# Patient Record
Sex: Female | Born: 1990 | Race: White | Hispanic: No | Marital: Single | Smoking: Former smoker
Health system: Southern US, Community
[De-identification: ages and names within clinical notes are randomized; demographics above are authoritative.]

## PROBLEM LIST (undated history)

## (undated) DIAGNOSIS — IMO0001 Reserved for inherently not codable concepts without codable children: Secondary | ICD-10-CM

## (undated) DIAGNOSIS — F329 Major depressive disorder, single episode, unspecified: Principal | ICD-10-CM

## (undated) DIAGNOSIS — F419 Anxiety disorder, unspecified: Principal | ICD-10-CM

## (undated) DIAGNOSIS — T7840XA Allergy, unspecified, initial encounter: Secondary | ICD-10-CM

## (undated) DIAGNOSIS — IMO0002 Reserved for concepts with insufficient information to code with codable children: Secondary | ICD-10-CM

## (undated) DIAGNOSIS — R569 Unspecified convulsions: Secondary | ICD-10-CM

## (undated) DIAGNOSIS — J45909 Unspecified asthma, uncomplicated: Secondary | ICD-10-CM

## (undated) HISTORY — DX: Reserved for inherently not codable concepts without codable children: IMO0001

## (undated) HISTORY — DX: Unspecified asthma, uncomplicated: J45.909

## (undated) HISTORY — DX: Anxiety disorder, unspecified: F41.9

## (undated) HISTORY — DX: Major depressive disorder, single episode, unspecified: F32.9

## (undated) HISTORY — DX: Allergy, unspecified, initial encounter: T78.40XA

## (undated) HISTORY — DX: Unspecified convulsions: R56.9

---
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
## 2008-03-07 ENCOUNTER — Emergency Department (HOSPITAL_COMMUNITY): Admission: EM | Admit: 2008-03-07 | Discharge: 2008-03-07 | Payer: Self-pay | Admitting: Emergency Medicine

## 2010-01-29 ENCOUNTER — Emergency Department (HOSPITAL_COMMUNITY): Admission: EM | Admit: 2010-01-29 | Discharge: 2010-01-29 | Payer: Self-pay | Admitting: Emergency Medicine

## 2011-02-11 LAB — POCT I-STAT, CHEM 8
Calcium, Ion: 1.25
Creatinine, Ser: 0.9
Glucose, Bld: 100 — ABNORMAL HIGH
HCT: 34 — ABNORMAL LOW
Hemoglobin: 11.6 — ABNORMAL LOW
TCO2: 23

## 2011-02-11 LAB — URINALYSIS, ROUTINE W REFLEX MICROSCOPIC
Bilirubin Urine: NEGATIVE
Glucose, UA: NEGATIVE
Hgb urine dipstick: NEGATIVE
Ketones, ur: NEGATIVE
Nitrite: NEGATIVE
Protein, ur: NEGATIVE
Specific Gravity, Urine: 1.014
Urobilinogen, UA: 0.2
pH: 5.5

## 2011-02-11 LAB — RAPID URINE DRUG SCREEN, HOSP PERFORMED
Amphetamines: NOT DETECTED
Barbiturates: NOT DETECTED
Benzodiazepines: POSITIVE — AB
Cocaine: NOT DETECTED
Opiates: NOT DETECTED
Tetrahydrocannabinol: NOT DETECTED

## 2011-02-11 LAB — POCT PREGNANCY, URINE: Preg Test, Ur: NEGATIVE

## 2011-02-11 LAB — GLUCOSE, CAPILLARY: Glucose-Capillary: 93

## 2011-02-11 LAB — ETHANOL: Alcohol, Ethyl (B): <5

## 2011-10-02 ENCOUNTER — Ambulatory Visit (INDEPENDENT_AMBULATORY_CARE_PROVIDER_SITE_OTHER): Payer: BC Managed Care – PPO | Admitting: Internal Medicine

## 2011-10-02 ENCOUNTER — Encounter: Payer: Self-pay | Admitting: Internal Medicine

## 2011-10-02 VITALS — BP 120/84 | HR 88 | Temp 98.3°F | Wt 169.0 lb

## 2011-10-02 DIAGNOSIS — M25569 Pain in unspecified knee: Secondary | ICD-10-CM

## 2011-10-02 NOTE — Progress Notes (Signed)
  Subjective:    Patient ID: Victoria Ross, female    DOB: [birth year], 21 y.o.   MRN: [id number]  HPI New patient, acute visit. 4 days ago, she jumped off a 3 feet tall wall, landed on her feet, no fall or apparent problems. The next day developed right knee pain which is getting gradually worse. The pain is located anteriorly around the kneecap and somehow in the popliteal area. Very mild swelling. She has taken ibuprofen 800 mg and Vicodin with very little help.  Past medical history H/o ADD  G0P0 Not on birth-control pills  Past surgical history no Social history Referred to this office by the [facility] (my patients). Single  Tobacco--  1/2 ppd  ETOH -- socially   Family history Adopted    Review of Systems Denies any pain or swelling in the calves. No other injuries such as neck or shoulder pain.     Objective:   Physical Exam General -- alert, well-developed, some distress due to pain, unable to put pressure in the knee.  Extremities--  Left knee normal to inspection, palpation and passive mobilization. Right knee, very little if any swelling, very tender to palpation anteriorly distal from the kneecap. I attempted passive mobilization but it caused severe pains so I stopped. Calves  are soft, nontender. Right calf is half centimeter larger than the left.  Psych-- Cognition and judgment appear intact. Alert and cooperative with normal attention span and concentration.  not anxious appearing and not depressed appearing.       Assessment & Plan:    Acute right knee pain. Patient is in severe pain, will refer to orthopedic surgery today. She reports similar pain when she was 21 years old, resolved after physical therapy The right calf is half centimeter larger, but soft and nontender, I doubt she has a DVT , asked her to call me if she notices any pain-swelling in that area. See instructions.

## 2011-10-02 NOTE — Patient Instructions (Signed)
[facility], Dr. Renae Fickle at 3:30 PM today. Arrive 15 minutes earlier  [address] [place], [place] [address] [phone]

## 2011-10-08 ENCOUNTER — Telehealth: Payer: Self-pay | Admitting: *Deleted

## 2011-10-08 NOTE — Telephone Encounter (Signed)
Called pt to check & see how her knee was. Pt did not answer & was unable to leave a msg.

## 2011-10-10 ENCOUNTER — Ambulatory Visit: Payer: Self-pay | Admitting: Internal Medicine

## 2011-12-09 ENCOUNTER — Ambulatory Visit (INDEPENDENT_AMBULATORY_CARE_PROVIDER_SITE_OTHER): Payer: BC Managed Care – PPO | Admitting: Internal Medicine

## 2011-12-09 ENCOUNTER — Encounter: Payer: Self-pay | Admitting: Internal Medicine

## 2011-12-09 VITALS — BP 120/82 | HR 64 | Temp 98.2°F | Wt 166.0 lb

## 2011-12-09 DIAGNOSIS — R1909 Other intra-abdominal and pelvic swelling, mass and lump: Secondary | ICD-10-CM | POA: Insufficient documentation

## 2011-12-09 DIAGNOSIS — IMO0002 Reserved for concepts with insufficient information to code with codable children: Secondary | ICD-10-CM | POA: Insufficient documentation

## 2011-12-09 DIAGNOSIS — S82899A Other fracture of unspecified lower leg, initial encounter for closed fracture: Secondary | ICD-10-CM

## 2011-12-09 DIAGNOSIS — R1900 Intra-abdominal and pelvic swelling, mass and lump, unspecified site: Secondary | ICD-10-CM

## 2011-12-09 NOTE — Assessment & Plan Note (Signed)
Tender inguinal mass, hernia versus lymphadenopathy. There is no signs of incarceration. Lower abdomen is a slightly tender without peritoneal signs Plan: Refer to surgery as an outpatient, I'll ask them to see her tomorrow. ER if symptoms severe, fever, chills, nausea, vomiting, abdominal pain. Check a UA

## 2011-12-09 NOTE — Progress Notes (Signed)
  Subjective:    Patient ID: Victoria Ross, female    DOB: [birth year], 21 y.o.   MRN: [id number]  HPI Acute visit 4 days ago, she found a knot on the right groin area, it hurts some when she pushes the area. In the last 4 days it has grown in size a little bit currently at the size of a quarter.  Past medical history H/o ADD   G0P0 Not on birth-control pills Tibial plateau fracture, right side, treated conservatively 09/2011  Past surgical history no Social history  Referred to this office by the [facility] (my patients). 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 No dysuria or gross hematuria. No vaginal discharge or rash Was seen at orthopedic surgery for knee pain, see assessment and plan    Objective:   Physical Exam  Abdominal:      alert oriented x3, no apparent distress, vital signs stable, afebrile. Abdomen, not distended, soft, slightly tender at the lower abdomen, R>L , no mass or rebound. Good bowel sounds.      Assessment & Plan:

## 2011-12-09 NOTE — Patient Instructions (Addendum)
Please see Dr. Daphine Deutscher, tomorrow at 3 PM at [facility]. [address], [place], [place] [address] [phone] ---- ER is fever, chills, increased pain, increased in the mass size, abdominal pain, fever, chills

## 2011-12-09 NOTE — Assessment & Plan Note (Signed)
09/2011 was seen with a knee injury, eventually saw orthopedic surgery, dx with a tibial plateau fracture, treated conservatively, currently doing better

## 2011-12-10 ENCOUNTER — Encounter (INDEPENDENT_AMBULATORY_CARE_PROVIDER_SITE_OTHER): Payer: Self-pay | Admitting: Surgery

## 2011-12-10 ENCOUNTER — Other Ambulatory Visit (INDEPENDENT_AMBULATORY_CARE_PROVIDER_SITE_OTHER): Payer: Self-pay | Admitting: General Surgery

## 2011-12-10 ENCOUNTER — Ambulatory Visit (INDEPENDENT_AMBULATORY_CARE_PROVIDER_SITE_OTHER): Payer: BC Managed Care – PPO | Admitting: Surgery

## 2011-12-10 VITALS — BP 112/62 | HR 68 | Temp 97.1°F | Resp 16 | Ht 64.0 in | Wt 165.4 lb

## 2011-12-10 DIAGNOSIS — K4030 Unilateral inguinal hernia, with obstruction, without gangrene, not specified as recurrent: Secondary | ICD-10-CM

## 2011-12-10 NOTE — Patient Instructions (Addendum)
Your surgeon is Dr. Almond Lint You should expect a call in the morning about an early afternoon surgery at [facility] Nothing to eat or drink after midnight tonight.    Inguinal Hernia, Adult Muscles help keep everything in the body in its proper place. But if a weak spot in the muscles develops, something can poke through. That is called a hernia. When this happens in the lower part of the belly (abdomen), it is called an inguinal hernia. (It takes its name from a part of the body in this region called the inguinal canal.) A weak spot in the wall of muscles lets some fat or part of the small intestine bulge through. An inguinal hernia can develop at any age. Men get them more often than women. CAUSES  In adults, an inguinal hernia develops over time.  It can be triggered by:   Suddenly straining the muscles of the lower abdomen.   Lifting heavy objects.   Straining to have a bowel movement. Difficult bowel movements (constipation) can lead to this.   Constant coughing. This may be caused by smoking or lung disease.   Being overweight.   Being pregnant.   Working at a job that requires long periods of standing or heavy lifting.   Having had an inguinal hernia before.  One type can be an emergency situation. It is called a strangulated inguinal hernia. It develops if part of the small intestine slips through the weak spot and cannot get back into the abdomen. The blood supply can be cut off. If that happens, part of the intestine may die. This situation requires emergency surgery. SYMPTOMS  Often, a small inguinal hernia has no symptoms. It is found when a healthcare provider does a physical exam. Larger hernias usually have symptoms.   In adults, symptoms may include:   A lump in the groin. This is easier to see when the person is standing. It might disappear when lying down.   In men, a lump in the scrotum.   Pain or burning in the groin. This occurs especially when lifting,  straining or coughing.   A dull ache or feeling of pressure in the groin.   Signs of a strangulated hernia can include:   A bulge in the groin that becomes very painful and tender to the touch.   A bulge that turns red or purple.   Fever, nausea and vomiting.   Inability to have a bowel movement or to pass gas.  DIAGNOSIS  To decide if you have an inguinal hernia, a healthcare provider will probably do a physical examination.  This will include asking questions about any symptoms you have noticed.   The healthcare provider might feel the groin area and ask you to cough. If an inguinal hernia is felt, the healthcare provider may try to slide it back into the abdomen.   Usually no other tests are needed.  TREATMENT  Treatments can vary. The size of the hernia makes a difference. Options include:  Watchful waiting. This is often suggested if the hernia is small and you have had no symptoms.   No medical procedure will be done unless symptoms develop.   You will need to watch closely for symptoms. If any occur, contact your healthcare provider right away.   Surgery. This is used if the hernia is larger or you have symptoms.   Open surgery. This is usually an outpatient procedure (you will not stay overnight in a hospital). An cut (incision) is  made through the skin in the groin. The hernia is put back inside the abdomen. The weak area in the muscles is then repaired by herniorrhaphy or hernioplasty. Herniorrhaphy: in this type of surgery, the weak muscles are sewn back together. Hernioplasty: a patch or mesh is used to close the weak area in the abdominal wall.   Laparoscopy. In this procedure, a surgeon makes small incisions. A thin tube with a tiny video camera (called a laparoscope) is put into the abdomen. The surgeon repairs the hernia with mesh by looking with the video camera and using two long instruments.  HOME CARE INSTRUCTIONS   After surgery to repair an inguinal hernia:     You will need to take pain medicine prescribed by your healthcare provider. Follow all directions carefully.   You will need to take care of the wound from the incision.   Your activity will be restricted for awhile. This will probably include no heavy lifting for several weeks. You also should not do anything too active for a few weeks. When you can return to work will depend on the type of job that you have.   During "watchful waiting" periods, you should:   Maintain a healthy weight.   Eat a diet high in fiber (fruits, vegetables and whole grains).   Drink plenty of fluids to avoid constipation. This means drinking enough water and other liquids to keep your urine clear or pale yellow.   Do not lift heavy objects.   Do not stand for long periods of time.   Quit smoking. This should keep you from developing a frequent cough.  SEEK MEDICAL CARE IF:   A bulge develops in your groin area.   You feel pain, a burning sensation or pressure in the groin. This might be worse if you are lifting or straining.   You develop a fever of more than 100.5 F (38.1 C).  SEEK IMMEDIATE MEDICAL CARE IF:   Pain in the groin increases suddenly.   A bulge in the groin gets bigger suddenly and does not go down.   For men, there is sudden pain in the scrotum. Or, the size of the scrotum increases.   A bulge in the groin area becomes red or purple and is painful to touch.   You have nausea or vomiting that does not go away.   You feel your heart beating much faster than normal.   You cannot have a bowel movement or pass gas.   You develop a fever of more than 102.0 F (38.9 C).  Document Released: 09/14/2008 Document Revised: 04/17/2011 Document Reviewed: 09/14/2008 [facility] Patient Information 2012 [place], [place].

## 2011-12-10 NOTE — Progress Notes (Signed)
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 Willow Ora with a mass in her right groin that he palpated and tried to reduce.  Subsequently, it was painful.  He made an appointment for her to see me in the office today but I am unavailable to do this in a timely fashion.  Dr. Donell Beers has agreed to see and perform as [facility] DOW.    She denies nausea or vomiting.  Tender since her exam yesterday.  I explained open inguinal exploration for inguinal and femoral herniae.    Past Medical History  Diagnosis Date  . Seizures     History reviewed. No pertinent past surgical history.  Current Outpatient Prescriptions  Medication Sig Dispense Refill  . ibuprofen (ADVIL,MOTRIN) 800 MG tablet Take 800 mg by mouth every 6 (six) hours as needed.       Tramadol Family History  Problem Relation Age of Onset  . Adopted: Yes  . Family history unknown: Yes   Social History:   reports that she has been smoking Cigarettes.  She has a 2 pack-year smoking history. She has never used smokeless tobacco. She reports that she drinks alcohol. She reports that she does not use illicit drugs.   REVIEW OF SYSTEMS - PERTINENT POSITIVES ONLY: Wearing a brace for knee fracture requiring crutches for 8 weeks.  Denies DVT.    Physical Exam:   Blood pressure 112/62, pulse 68, temperature 97.1 F (36.2 C), temperature source Temporal, resp. rate 16, height 5\' 4"  (1.626 m), weight 165 lb 6.4 oz (75.025 kg), last menstrual period 12/06/2011. Body mass index is 28.39 kg/(m^2).  Gen:  WDWN WF NAD  Neurological: Alert and oriented to person, place, and time. Motor and sensory function is grossly intact  Head: Normocephalic and atraumatic.  Eyes: Conjunctivae are normal. Pupils are equal, round, and reactive to light. No scleral icterus.  Neck: Normal range of motion. Neck supple. No tracheal deviation or thyromegaly present.    Cardiovascular:  SR without murmurs or gallops.  No carotid bruits Respiratory: Effort normal.  No respiratory distress. No chest wall tenderness. Breath sounds normal.  No wheezes, rales or rhonchi.  Abdomen:  Tender soft mass in the right inguinal canal.   GU: Musculoskeletal: Normal range of motion. Extremities are nontender. No cyanosis, edema or clubbing noted Lymphadenopathy: No cervical, preauricular, postauricular or axillary adenopathy is present Skin: Skin is warm and dry. No rash noted. No diaphoresis. No erythema. No pallor. Pscyh: Normal mood and affect. Behavior is normal. Judgment and thought content normal.   LABORATORY RESULTS: No results found for this or any previous visit (from the past 48 hour(s)).  RADIOLOGY RESULTS: No results found.  Problem List: Patient Active Problem List  Diagnosis  . Inguinal mass  . Knee fracture    Assessment & Plan: Symptomatic right inguinal hernia.  Repair Thursday at [facility] B. Daphine Deutscher, MD, [facility] [phone] beeper [phone]  12/10/2011 5:23 PM

## 2011-12-11 ENCOUNTER — Encounter (HOSPITAL_COMMUNITY): Payer: Self-pay | Admitting: *Deleted

## 2011-12-11 ENCOUNTER — Encounter (HOSPITAL_COMMUNITY)
Admission: RE | Disposition: A | Discharge status: Home / Self Care | Payer: Self-pay | Source: Ambulatory Visit | Attending: General Surgery

## 2011-12-11 ENCOUNTER — Encounter (HOSPITAL_COMMUNITY): Payer: Self-pay | Admitting: Certified Registered Nurse Anesthetist

## 2011-12-11 ENCOUNTER — Ambulatory Visit (HOSPITAL_COMMUNITY)
Admission: RE | Admit: 2011-12-11 | Discharge: 2011-12-11 | Disposition: A | Discharge status: Home / Self Care | Payer: BC Managed Care – PPO | Source: Ambulatory Visit | Attending: General Surgery | Admitting: General Surgery

## 2011-12-11 ENCOUNTER — Ambulatory Visit (HOSPITAL_COMMUNITY): Payer: BC Managed Care – PPO | Admitting: Certified Registered Nurse Anesthetist

## 2011-12-11 DIAGNOSIS — R599 Enlarged lymph nodes, unspecified: Secondary | ICD-10-CM | POA: Insufficient documentation

## 2011-12-11 DIAGNOSIS — K4090 Unilateral inguinal hernia, without obstruction or gangrene, not specified as recurrent: Secondary | ICD-10-CM | POA: Insufficient documentation

## 2011-12-11 DIAGNOSIS — K4190 Unilateral femoral hernia, without obstruction or gangrene, not specified as recurrent: Secondary | ICD-10-CM | POA: Insufficient documentation

## 2011-12-11 HISTORY — PX: INGUINAL HERNIA REPAIR: SHX194

## 2011-12-11 HISTORY — DX: Reserved for concepts with insufficient information to code with codable children: IMO0002

## 2011-12-11 LAB — CBC
MCH: 29.4 pg (ref 26.0–34.0)
MCHC: 34 g/dL (ref 30.0–36.0)
MCV: 86.5 fL (ref 78.0–100.0)
Platelets: 204 10*3/uL (ref 150–400)
RBC: 4.15 MIL/uL (ref 3.87–5.11)
RDW: 12.2 % (ref 11.5–15.5)

## 2011-12-11 LAB — GRAM STAIN

## 2011-12-11 SURGERY — REPAIR, HERNIA, INGUINAL, ADULT
Anesthesia: General | Site: Groin | Laterality: Right | Wound class: Clean

## 2011-12-11 MED ORDER — PROPOFOL 10 MG/ML IV EMUL
INTRAVENOUS | Status: DC | PRN
  Administered 2011-12-11: 150 mg via INTRAVENOUS

## 2011-12-11 MED ORDER — ACETAMINOPHEN 10 MG/ML IV SOLN
INTRAVENOUS | Status: AC
  Filled 2011-12-11: qty 100

## 2011-12-11 MED ORDER — FENTANYL CITRATE 0.05 MG/ML IJ SOLN: 25.0000 ug | INTRAMUSCULAR | Status: DC | PRN

## 2011-12-11 MED ORDER — ACETAMINOPHEN 650 MG RE SUPP
650.0000 mg | RECTAL | Status: DC | PRN
  Filled 2011-12-11: qty 1

## 2011-12-11 MED ORDER — BUPIVACAINE LIPOSOME 1.3 % IJ SUSP
20.0000 mL | Freq: Once | INTRAMUSCULAR | Status: AC
  Administered 2011-12-11: 20 mL
  Filled 2011-12-11: qty 20

## 2011-12-11 MED ORDER — ACETAMINOPHEN 10 MG/ML IV SOLN
INTRAVENOUS | Status: DC | PRN
  Administered 2011-12-11: 1000 mg via INTRAVENOUS

## 2011-12-11 MED ORDER — MUPIROCIN CALCIUM 2 % EX CREA
TOPICAL_CREAM | Freq: Two times a day (BID) | CUTANEOUS | Status: DC
  Filled 2011-12-11: qty 15

## 2011-12-11 MED ORDER — ONDANSETRON HCL 4 MG/2ML IJ SOLN: 4.0000 mg | Freq: Four times a day (QID) | INTRAMUSCULAR | Status: DC | PRN

## 2011-12-11 MED ORDER — PROMETHAZINE HCL 25 MG/ML IJ SOLN: 6.2500 mg | INTRAMUSCULAR | Status: DC | PRN

## 2011-12-11 MED ORDER — SCOPOLAMINE 1 MG/3DAYS TD PT72
MEDICATED_PATCH | TRANSDERMAL | Status: DC | PRN
  Administered 2011-12-11: 1 via TRANSDERMAL

## 2011-12-11 MED ORDER — LIDOCAINE HCL (CARDIAC) 20 MG/ML IV SOLN
INTRAVENOUS | Status: DC | PRN
  Administered 2011-12-11: 80 mg via INTRAVENOUS

## 2011-12-11 MED ORDER — ONDANSETRON HCL 4 MG/2ML IJ SOLN
INTRAMUSCULAR | Status: DC | PRN
  Administered 2011-12-11: 4 mg via INTRAVENOUS

## 2011-12-11 MED ORDER — CEFAZOLIN SODIUM-DEXTROSE 2-3 GM-% IV SOLR
2.0000 g | INTRAVENOUS | Status: AC
  Administered 2011-12-11: 2 g via INTRAVENOUS

## 2011-12-11 MED ORDER — SODIUM CHLORIDE 0.9 % IV SOLN: 250.0000 mL | INTRAVENOUS | Status: DC | PRN

## 2011-12-11 MED ORDER — SODIUM CHLORIDE 0.9 % IJ SOLN: 3.0000 mL | INTRAMUSCULAR | Status: DC | PRN

## 2011-12-11 MED ORDER — OXYCODONE HCL 5 MG PO TABS
5.0000 mg | ORAL_TABLET | ORAL | Status: DC | PRN
  Administered 2011-12-11: 5 mg via ORAL
  Filled 2011-12-11: qty 1

## 2011-12-11 MED ORDER — FENTANYL CITRATE 0.05 MG/ML IJ SOLN
INTRAMUSCULAR | Status: DC | PRN
  Administered 2011-12-11: 50 ug via INTRAVENOUS
  Administered 2011-12-11: 100 ug via INTRAVENOUS
  Administered 2011-12-11: 50 ug via INTRAVENOUS

## 2011-12-11 MED ORDER — MIDAZOLAM HCL 5 MG/5ML IJ SOLN
INTRAMUSCULAR | Status: DC | PRN
  Administered 2011-12-11: 2 mg via INTRAVENOUS

## 2011-12-11 MED ORDER — LACTATED RINGERS IV SOLN
INTRAVENOUS | Status: DC
  Administered 2011-12-11: 1000 mL via INTRAVENOUS

## 2011-12-11 MED ORDER — HYDROCODONE-ACETAMINOPHEN 5-325 MG PO TABS: 1.0000 | ORAL_TABLET | ORAL | Status: DC | PRN

## 2011-12-11 MED ORDER — SCOPOLAMINE 1 MG/3DAYS TD PT72
MEDICATED_PATCH | TRANSDERMAL | Status: AC
  Filled 2011-12-11: qty 1

## 2011-12-11 MED ORDER — ACETAMINOPHEN 325 MG PO TABS: 650.0000 mg | ORAL_TABLET | ORAL | Status: DC | PRN

## 2011-12-11 MED ORDER — DEXAMETHASONE SODIUM PHOSPHATE 10 MG/ML IJ SOLN
INTRAMUSCULAR | Status: DC | PRN
  Administered 2011-12-11: 10 mg via INTRAVENOUS

## 2011-12-11 MED ORDER — CEFAZOLIN SODIUM-DEXTROSE 2-3 GM-% IV SOLR
INTRAVENOUS | Status: AC
  Filled 2011-12-11: qty 50

## 2011-12-11 MED ORDER — SUCCINYLCHOLINE CHLORIDE 20 MG/ML IJ SOLN
INTRAMUSCULAR | Status: DC | PRN
  Administered 2011-12-11: 100 mg via INTRAVENOUS

## 2011-12-11 MED ORDER — SODIUM CHLORIDE 0.9 % IJ SOLN: 3.0000 mL | Freq: Two times a day (BID) | INTRAMUSCULAR | Status: DC

## 2011-12-11 SURGICAL SUPPLY — 46 items
ADH SKN CLS APL DERMABOND .7 (GAUZE/BANDAGES/DRESSINGS) ×1
APL SKNCLS STERI-STRIP NONHPOA (GAUZE/BANDAGES/DRESSINGS)
BENZOIN TINCTURE PRP APPL 2/3 (GAUZE/BANDAGES/DRESSINGS) IMPLANT
BLADE HEX COATED 2.75 (ELECTRODE) ×2 IMPLANT
BLADE SURG 15 STRL LF DISP TIS (BLADE) ×1 IMPLANT
BLADE SURG 15 STRL SS (BLADE) ×2
CANISTER SUCTION 2500CC (MISCELLANEOUS) ×2 IMPLANT
CLOTH BEACON ORANGE TIMEOUT ST (SAFETY) ×2 IMPLANT
DECANTER SPIKE VIAL GLASS SM (MISCELLANEOUS) ×2 IMPLANT
DERMABOND ADVANCED (GAUZE/BANDAGES/DRESSINGS) ×1
DERMABOND ADVANCED .7 DNX12 (GAUZE/BANDAGES/DRESSINGS) IMPLANT
DISSECTOR ROUND CHERRY 3/8 STR (MISCELLANEOUS) ×2 IMPLANT
DRAIN PENROSE 18X1/2 LTX STRL (DRAIN) IMPLANT
DRAPE LAPAROSCOPIC ABDOMINAL (DRAPES) ×2 IMPLANT
ELECT REM PT RETURN 9FT ADLT (ELECTROSURGICAL) ×2
ELECTRODE REM PT RTRN 9FT ADLT (ELECTROSURGICAL) ×1 IMPLANT
GLOVE BIO SURGEON STRL SZ 6 (GLOVE) ×4 IMPLANT
GLOVE BIOGEL PI IND STRL 6.5 (GLOVE) ×1 IMPLANT
GLOVE BIOGEL PI IND STRL 7.0 (GLOVE) ×1 IMPLANT
GLOVE BIOGEL PI INDICATOR 6.5 (GLOVE) ×1
GLOVE BIOGEL PI INDICATOR 7.0 (GLOVE) ×2
GLOVE INDICATOR 6.5 STRL GRN (GLOVE) ×4 IMPLANT
GOWN PREVENTION PLUS XXLARGE (GOWN DISPOSABLE) ×2 IMPLANT
KIT BASIN OR (CUSTOM PROCEDURE TRAY) ×2 IMPLANT
MESH PARIETEX PROGRIP RIGHT (Mesh General) ×1 IMPLANT
NDL HYPO 25X1 1.5 SAFETY (NEEDLE) ×1 IMPLANT
NEEDLE HYPO 25X1 1.5 SAFETY (NEEDLE) ×2 IMPLANT
NS IRRIG 1000ML POUR BTL (IV SOLUTION) ×3 IMPLANT
PACK BASIC VI WITH GOWN DISP (CUSTOM PROCEDURE TRAY) ×2 IMPLANT
PENCIL BUTTON HOLSTER BLD 10FT (ELECTRODE) ×2 IMPLANT
SPONGE GAUZE 4X4 12PLY (GAUZE/BANDAGES/DRESSINGS) ×2 IMPLANT
SPONGE LAP 18X18 X RAY DECT (DISPOSABLE) ×2 IMPLANT
STRIP CLOSURE SKIN 1/2X4 (GAUZE/BANDAGES/DRESSINGS) IMPLANT
SUT MNCRL AB 4-0 PS2 18 (SUTURE) ×2 IMPLANT
SUT PROLENE 2 0 SH DA (SUTURE) ×4 IMPLANT
SUT SILK 2 0 SH (SUTURE) IMPLANT
SUT SILK 3 0 (SUTURE)
SUT SILK 3-0 18XBRD TIE 12 (SUTURE) IMPLANT
SUT VIC AB 2-0 SH 27 (SUTURE) ×2
SUT VIC AB 2-0 SH 27X BRD (SUTURE) ×1 IMPLANT
SUT VIC AB 3-0 SH 27 (SUTURE) ×4
SUT VIC AB 3-0 SH 27XBRD (SUTURE) ×2 IMPLANT
SYR BULB IRRIGATION 50ML (SYRINGE) ×2 IMPLANT
SYR CONTROL 10ML LL (SYRINGE) ×2 IMPLANT
TOWEL OR 17X26 10 PK STRL BLUE (TOWEL DISPOSABLE) ×2 IMPLANT
YANKAUER SUCT BULB TIP 10FT TU (MISCELLANEOUS) ×2 IMPLANT

## 2011-12-11 NOTE — Interval H&P Note (Signed)
History and Physical Interval Note:  12/11/2011 12:55 PM  Victoria Ross  has presented today for surgery, with the diagnosis of right inguinal hernia  The various methods of treatment have been discussed with the patient and family. After consideration of risks, benefits and other options for treatment, the patient has consented to  Procedure(s) (LRB): HERNIA REPAIR INGUINAL ADULT (Right) INSERTION OF MESH (Right) as a surgical intervention .  The patient's history has been reviewed, patient examined, no change in status, stable for surgery.  I have reviewed the patient's chart and labs.  Questions were answered to the patient's satisfaction.   I discussed that I may find an incarcerated loop of bowel which may require more extensive surgery than previously thought.    Teegan Guinther

## 2011-12-11 NOTE — Anesthesia Postprocedure Evaluation (Signed)
  Anesthesia Post-op Note  Patient: Victoria Ross  Procedure(s) Performed: Procedure(s) (LRB): HERNIA REPAIR INGUINAL ADULT (Right) INSERTION OF MESH (Right)  Patient Location: PACU  Anesthesia Type: General  Level of Consciousness: awake and alert   Airway and Oxygen Therapy: Patient Spontanous Breathing  Post-op Pain: mild  Post-op Assessment: Post-op Vital signs reviewed, Patient's Cardiovascular Status Stable, Respiratory Function Stable, Patent Airway and No signs of Nausea or vomiting  Post-op Vital Signs: stable  Complications: No apparent anesthesia complications

## 2011-12-11 NOTE — Transfer of Care (Signed)
Immediate Anesthesia Transfer of Care Note  Patient: Victoria Ross  Procedure(s) Performed: Procedure(s) (LRB): HERNIA REPAIR INGUINAL ADULT (Right) INSERTION OF MESH (Right)  Patient Location: PACU  Anesthesia Type: General  Level of Consciousness: sedated, patient cooperative and responds to stimulaton  Airway & Oxygen Therapy: Patient Spontanous Breathing and Patient connected to face mask oxgen  Post-op Assessment: Report given to PACU RN and Post -op Vital signs reviewed and stable  Post vital signs: Reviewed and stable  Complications: No apparent anesthesia complications

## 2011-12-11 NOTE — Op Note (Signed)
Right inguinal lymph node biopsy, right inguinal and femoral hernia repair  Indications: The patient presented with a history of irreducible right groin bulge and tenderness.    Pre-operative Diagnosis: right incarcerated inguinal hernia  Post-operative Diagnosis: Necrotic right groin node, femoral hernia, direct inguinal hernia  Surgeon: Almond Lint   Anesthesia: General endotracheal anesthesia  ASA Class: 2  Procedure Details  The patient was seen again in the Holding Room. The risks, benefits, complications, treatment options, and expected outcomes were discussed with the patient. The possibilities of reaction to medication, pulmonary aspiration, perforation of viscus, bleeding, recurrent infection, the need for additional procedures, and development of a complication requiring transfusion or further operation were discussed with the patient and/or family. The likelihood of success in repairing the hernia and returning the patient to their previous functional status is good.  There was concurrence with the proposed plan, and informed consent was obtained. The site of surgery was properly noted/marked. The patient was taken to the Operating Room, identified as Victoria Ross, and the procedure verified as right inguinal hernia repair. A Time Out was held and the above information confirmed.  The patient was placed in the supine position and underwent induction of anesthesia. The lower abdomen and groin was prepped with Chloraprep and draped in the standard fashion, and Exparel was used to anesthetize the skin over the mid-portion of the inguinal canal. An oblique incision was made. Dissection was carried down through the subcutaneous tissue with cautery to the external oblique fascia.  I opened the external oblique fascia along the direction of its fibers to the external ring. The iliohypogastric and ilioinguinal nerves were tucked behind the external oblique.  The floor of the inguinal canal was  inspected and there was laxity of the inguinal floor.  The round ligament was skeletonized. There was no evidence of indirect inguinal hernia.  There was a large necrotic lymph node associated with fatty tissue that appeared to be the omentum.  This was resected.  There was enlargement of the femoral canal.  This defect was closed with the 2-0 prolene in mattress fashion.  The inguinal ligament was attached down to the adductor magnus.  The progrip mesh was selected and trimmed to the appropriate size.  This was secured with 2-0 Prolene to the pubic tubercle.  The mesh was tucked underneath the external oblique fascia laterally.  The iliohypogastric nerve could not be mobilized enough to lie on top of the mesh, so this was cut with the Metzenbaum scissors.  The ilioinguinal nerve was laid on top of the mesh.  Additional exparel was injected.    The external oblique fascia was reapproximated with 2-0 Vicryl.  3-0 Vicryl was used to close the subcutaneous tissues and 4-0 Monocryl was used to close the skin in subcuticular fashion.  Dermabond was used for dressing.  The patient was then extubated and brought to the recovery room in stable condition.  All sponge, instrument, and needle counts were correct prior to closure and at the conclusion of the case.   Estimated Blood Loss: Minimal           Complications: None; patient tolerated the procedure well.         Disposition: PACU - hemodynamically stable.         Condition: stable  Specimens:  Lymph node for lymphoma workup, and for gram stain, culture, afb and fungal stain.

## 2011-12-11 NOTE — H&P (View-Only) (Signed)
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 Jose Paz with a mass in her right groin that he palpated and tried to reduce.  Subsequently, it was painful.  He made an appointment for her to see me in the office today but I am unavailable to do this in a timely fashion.  Dr. Byerly has agreed to see and perform as [facility] DOW.    She denies nausea or vomiting.  Tender since her exam yesterday.  I explained open inguinal exploration for inguinal and femoral herniae.    Past Medical History  Diagnosis Date  . Seizures     History reviewed. No pertinent past surgical history.  Current Outpatient Prescriptions  Medication Sig Dispense Refill  . ibuprofen (ADVIL,MOTRIN) 800 MG tablet Take 800 mg by mouth every 6 (six) hours as needed.       Tramadol Family History  Problem Relation Age of Onset  . Adopted: Yes  . Family history unknown: Yes   Social History:   reports that she has been smoking Cigarettes.  She has a 2 pack-year smoking history. She has never used smokeless tobacco. She reports that she drinks alcohol. She reports that she does not use illicit drugs.   REVIEW OF SYSTEMS - PERTINENT POSITIVES ONLY: Wearing a brace for knee fracture requiring crutches for 8 weeks.  Denies DVT.    Physical Exam:   Blood pressure 112/62, pulse 68, temperature 97.1 F (36.2 C), temperature source Temporal, resp. rate 16, height 5' 4" (1.626 m), weight 165 lb 6.4 oz (75.025 kg), last menstrual period 12/06/2011. Body mass index is 28.39 kg/(m^2).  Gen:  WDWN WF NAD  Neurological: Alert and oriented to person, place, and time. Motor and sensory function is grossly intact  Head: Normocephalic and atraumatic.  Eyes: Conjunctivae are normal. Pupils are equal, round, and reactive to light. No scleral icterus.  Neck: Normal range of motion. Neck supple. No tracheal deviation or thyromegaly present.    Cardiovascular:  SR without murmurs or gallops.  No carotid bruits Respiratory: Effort normal.  No respiratory distress. No chest wall tenderness. Breath sounds normal.  No wheezes, rales or rhonchi.  Abdomen:  Tender soft mass in the right inguinal canal.   GU: Musculoskeletal: Normal range of motion. Extremities are nontender. No cyanosis, edema or clubbing noted Lymphadenopathy: No cervical, preauricular, postauricular or axillary adenopathy is present Skin: Skin is warm and dry. No rash noted. No diaphoresis. No erythema. No pallor. Pscyh: Normal mood and affect. Behavior is normal. Judgment and thought content normal.   LABORATORY RESULTS: No results found for this or any previous visit (from the past 48 hour(s)).  RADIOLOGY RESULTS: No results found.  Problem List: Patient Active Problem List  Diagnosis  . Inguinal mass  . Knee fracture    Assessment & Plan: Symptomatic right inguinal hernia.  Repair Thursday at [facility]    Matt B. Shirrell Solinger, MD, FACS  [facility] [phone] beeper [phone]  12/10/2011 5:23 PM     

## 2011-12-11 NOTE — Anesthesia Preprocedure Evaluation (Addendum)
Anesthesia Evaluation  Patient identified by MRN, date of birth, ID band Patient awake    Reviewed: Allergy & Precautions, H&P , NPO status , Patient's Chart, lab work & pertinent test results  Airway Mallampati: II TM Distance: >3 FB Neck ROM: Full    Dental No notable dental hx.    Pulmonary neg pulmonary ROS, Current Smoker,  breath sounds clear to auscultation  Pulmonary exam normal       Cardiovascular negative cardio ROS  Rhythm:Regular Rate:Normal     Neuro/Psych Seizures -,  negative psych ROS   GI/Hepatic negative GI ROS, Neg liver ROS,   Endo/Other  negative endocrine ROS  Renal/GU negative Renal ROS  negative genitourinary   Musculoskeletal negative musculoskeletal ROS (+)   Abdominal   Peds negative pediatric ROS (+)  Hematology negative hematology ROS (+)   Anesthesia Other Findings   Reproductive/Obstetrics negative OB ROS                           Anesthesia Physical Anesthesia Plan  ASA: II  Anesthesia Plan: General   Post-op Pain Management:    Induction: Intravenous  Airway Management Planned: Oral ETT  Additional Equipment:   Intra-op Plan:   Post-operative Plan: Extubation in OR  Informed Consent: I have reviewed the patients History and Physical, chart, labs and discussed the procedure including the risks, benefits and alternatives for the proposed anesthesia with the patient or authorized representative who has indicated his/her understanding and acceptance.   Dental advisory given  Plan Discussed with: CRNA  Anesthesia Plan Comments:         Anesthesia Quick Evaluation

## 2011-12-12 ENCOUNTER — Encounter (HOSPITAL_COMMUNITY): Payer: Self-pay | Admitting: General Surgery

## 2011-12-14 LAB — TISSUE CULTURE

## 2011-12-15 ENCOUNTER — Telehealth (INDEPENDENT_AMBULATORY_CARE_PROVIDER_SITE_OTHER): Payer: Self-pay

## 2011-12-15 NOTE — Telephone Encounter (Signed)
Patient called in for pain medicine refill. I called  [facility] on [address]  And she had a refill on file so I had it refilled. She asked if it was normal to have bloating and abdominal pain. She is passing gas but no bowl movement since surgery. I suggested her take gas-x and MOM or Mirlax to get bowels moving. She will call us back if she can't get them moving. As for abdominal pain I told her she can expect it to last 4-6 weeks but once she starts using the bathroom she may have some relief.

## 2011-12-16 LAB — ANAEROBIC CULTURE

## 2011-12-17 ENCOUNTER — Telehealth (INDEPENDENT_AMBULATORY_CARE_PROVIDER_SITE_OTHER): Payer: Self-pay | Admitting: Surgery

## 2011-12-17 NOTE — Telephone Encounter (Signed)
The patient called concerned about not having a bowel movement since surgery.  She tried oral laxatives.  She ate solid food and throughout.  She was concerned.  She is not toxic.  Known parable pain.  I recommend she try an enema.  She did not want to do this in the middle of the night.  She was hoping to wait until morning.  I recommend that she drink liquids only until she had a bowel movement.  I recommended that if she cannot keep anything down that she go to the emergency room.  She expressed understanding and appreciation

## 2011-12-19 ENCOUNTER — Encounter (INDEPENDENT_AMBULATORY_CARE_PROVIDER_SITE_OTHER): Payer: Self-pay | Admitting: General Surgery

## 2011-12-19 ENCOUNTER — Telehealth (INDEPENDENT_AMBULATORY_CARE_PROVIDER_SITE_OTHER): Payer: Self-pay | Admitting: General Surgery

## 2011-12-19 NOTE — Telephone Encounter (Signed)
Pt calling to verify her post op restrictions and get letter for work.  Discussed not driving until (1) not taking narcotics for pain control and (2) must wear seat belt.  Also no lifting, carrying, pushing or pulling anything > 20 lbs.  She will be evaluated for RTW at her appt on 01/02/12.  Letter generated and FAX to attn:  Debbie at [phone], to that effect.

## 2011-12-22 ENCOUNTER — Telehealth (INDEPENDENT_AMBULATORY_CARE_PROVIDER_SITE_OTHER): Payer: Self-pay

## 2011-12-22 MED ORDER — HYDROCODONE-ACETAMINOPHEN 5-325 MG PO TABS: 1.0000 | ORAL_TABLET | ORAL | Status: AC | PRN

## 2011-12-22 NOTE — Addendum Note (Signed)
Addended byLiliana Cline on: 12/22/2011 05:58 PM   Modules accepted: Orders

## 2011-12-22 NOTE — Telephone Encounter (Signed)
Pt calling for refill of Vicodin.  She is still having a lot of pain, and the Ibuprofen helps some, but she cannot sleep at night.  Call to [facility] [phone].

## 2011-12-22 NOTE — Telephone Encounter (Signed)
Ok per Dr Donell Beers to call in #60 of Norco 5/325. Called to [employer]. Patient made aware.

## 2011-12-25 ENCOUNTER — Encounter (INDEPENDENT_AMBULATORY_CARE_PROVIDER_SITE_OTHER): Payer: Self-pay

## 2012-01-05 ENCOUNTER — Encounter (INDEPENDENT_AMBULATORY_CARE_PROVIDER_SITE_OTHER): Payer: Self-pay

## 2012-01-05 ENCOUNTER — Ambulatory Visit (INDEPENDENT_AMBULATORY_CARE_PROVIDER_SITE_OTHER): Payer: BC Managed Care – PPO | Admitting: General Surgery

## 2012-01-05 ENCOUNTER — Encounter (INDEPENDENT_AMBULATORY_CARE_PROVIDER_SITE_OTHER): Payer: Self-pay | Admitting: General Surgery

## 2012-01-05 VITALS — BP 122/66 | HR 76 | Temp 97.9°F | Resp 16 | Ht 64.5 in | Wt 160.4 lb

## 2012-01-05 DIAGNOSIS — R1909 Other intra-abdominal and pelvic swelling, mass and lump: Secondary | ICD-10-CM

## 2012-01-05 DIAGNOSIS — R1900 Intra-abdominal and pelvic swelling, mass and lump, unspecified site: Secondary | ICD-10-CM

## 2012-01-05 NOTE — Progress Notes (Signed)
HISTORY: Patient is a 21 year old female who is now 3-1/2 weeks status post inguinal lymph node biopsy and inguinal/femoral hernia repair.  This was complicated by the fact that she had been in a knee immobilizer for a month prior to this. She was found to have a right inguinal bulge and taken to the operating room where the lymph node and hernia were found.  The hernia was very small.  She had significant pain for several weeks but now is feeling much better.  She is no longer taking pain medication.   EXAM: General:  Alert and oriented times 3 Incision:  Well healed.  Mild swelling.     PATHOLOGY: Lymph node, biopsy, right inguinal - NECROTIZING GRANULOMATOUS LYMPHADENITIS.   ASSESSMENT AND PLAN:   Right inguinal mass consistent with RIH Pt was found to have inguinal lymph node and hernia.  Doing better now.    Follow up as needed.  OK to go back to playing pool as tolerated.        Maudry Diego, MD Surgical Oncology, General & Endocrine Surgery [facility]  Willow Ora, MD Wanda Plump, MD

## 2012-01-05 NOTE — Patient Instructions (Signed)
OK to take baths/ swim.  No lifting over 30 pounds until 6 weeks post op.  (9/12)  OK to go back to playing pool when tolerated.  OK to get massage/chiropractic as desired.

## 2012-01-05 NOTE — Assessment & Plan Note (Signed)
Pt was found to have inguinal lymph node and hernia.  Doing better now.    Follow up as needed.  OK to go back to playing pool as tolerated.

## 2012-01-09 ENCOUNTER — Telehealth (INDEPENDENT_AMBULATORY_CARE_PROVIDER_SITE_OTHER): Payer: Self-pay | Admitting: General Surgery

## 2012-01-09 NOTE — Telephone Encounter (Signed)
Pt reports a sharp pain when she bends or twists slightly.  The pain resolves with repositioning.  Pt reassured this a common complaint at this point in her recovery from surgery.  She will call back as needed.

## 2012-01-19 ENCOUNTER — Ambulatory Visit (INDEPENDENT_AMBULATORY_CARE_PROVIDER_SITE_OTHER): Payer: BC Managed Care – PPO | Admitting: Internal Medicine

## 2012-01-19 ENCOUNTER — Encounter: Payer: Self-pay | Admitting: Internal Medicine

## 2012-01-19 VITALS — BP 110/78 | HR 76 | Temp 98.1°F | Ht 64.5 in | Wt 160.0 lb

## 2012-01-19 DIAGNOSIS — F419 Anxiety disorder, unspecified: Secondary | ICD-10-CM

## 2012-01-19 DIAGNOSIS — Z0000 Encounter for general adult medical examination without abnormal findings: Secondary | ICD-10-CM

## 2012-01-19 DIAGNOSIS — F32A Depression, unspecified: Secondary | ICD-10-CM

## 2012-01-19 DIAGNOSIS — F329 Major depressive disorder, single episode, unspecified: Secondary | ICD-10-CM

## 2012-01-19 HISTORY — DX: Depression, unspecified: F32.A

## 2012-01-19 HISTORY — DX: Anxiety disorder, unspecified: F41.9

## 2012-01-19 MED ORDER — ESCITALOPRAM OXALATE 10 MG PO TABS: 10.0000 mg | ORAL_TABLET | Freq: Every day | ORAL | Status: DC

## 2012-01-19 NOTE — Assessment & Plan Note (Signed)
Tdap today We'll get immunization records from previous PCP, a pediatrician. Does not recall having HPV or meningitis shot. Diet and exercise discussed Refer to gynecology

## 2012-01-19 NOTE — Assessment & Plan Note (Addendum)
See review of systems, she has issues with anxiety and depression. She is status post counseling and symptoms are slowly  increasing in the last few years. Plan:  Start Lexapro, she's not on birth control pills so will call if she ever suspect she is pregnant. Reassess in 6 weeks. Side effects discussed

## 2012-01-19 NOTE — Patient Instructions (Addendum)
Please sign a release of information and get the records from her previous doctor, specifically I'm interested in the immunizations Start Lexapro, come back in 6 weeks, call if side effects.

## 2012-01-19 NOTE — Progress Notes (Signed)
  Subjective:    Patient ID: Victoria Ross, female    DOB: Jan 22, 1991, 21 y.o.   MRN: [id number]  HPI CPX  Past medical history H/o ADD   G0P0 Not on birth-control pills Tibial plateau fracture, right side, treated conservatively 09/2011  Past surgical history 12-2011 Necrotic right groin node, femoral hernia, direct inguinal hernia (repaired w/ mesh)  Social history G0P0, lives w/ boyfriend Referred to this office by the [facility] (my patients). 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 No chest pain or shortness of breath No  nausea, vomiting, diarrhea For years, she had a "fear of disappointing people" which make her very nervous, anxious, and even depressed. Denies OCD type of symptoms At some point she was diagnosed with ADD but she thinks he was unable to focus due to feeling of anxiety. Currently denies any suicidal ideas but at some point in the past she did have some thoughts about it. She has known counseling but symptoms are increasing over the years gradually.  Current Outpatient Rx  Name Route Sig Dispense Refill  . ESCITALOPRAM OXALATE 10 MG PO TABS Oral Take 1 tablet (10 mg total) by mouth daily. 30 tablet 1       Objective:   Physical Exam  General -- alert, well-developed, and well-nourished.   Neck --no thyromegaly Lungs -- normal respiratory effort, no intercostal retractions, no accessory muscle use, and normal breath sounds.   Heart-- normal rate, regular rhythm, no murmur, and no gallop.   Abdomen--soft, non-tender, no distention, no masses, no HSM, no guarding, and no rigidity.   Extremities-- no pretibial edema bilaterally Neurologic-- alert & oriented X3 and strength normal in all extremities. Psych-- Cognition and  judgment appear intact. Alert and cooperative with normal attention span and concentration.  not anxious appearing and not depressed appearing.      Assessment & Plan:

## 2012-01-20 LAB — COMPREHENSIVE METABOLIC PANEL
CO2: 26 mEq/L (ref 19–32)
Creatinine, Ser: 0.6 mg/dL (ref 0.4–1.2)
GFR: 124.73 mL/min (ref >60.00)
Glucose, Bld: 74 mg/dL (ref 70–99)
Total Bilirubin: 0.6 mg/dL (ref 0.3–1.2)

## 2012-01-20 LAB — CBC WITH DIFFERENTIAL/PLATELET
Eosinophils Relative: 2.6 % (ref 0.0–5.0)
HCT: 37 % (ref 36.0–46.0)
Hemoglobin: 12.3 g/dL (ref 12.0–15.0)
Lymphs Abs: 3 10*3/uL (ref 0.7–4.0)
MCV: 88.4 fl (ref 78.0–100.0)
Monocytes Absolute: 0.4 10*3/uL (ref 0.1–1.0)
Monocytes Relative: 6 % (ref 3.0–12.0)
Neutro Abs: 2.7 10*3/uL (ref 1.4–7.7)
RDW: 12.8 % (ref 11.5–14.6)

## 2012-01-20 LAB — TSH: TSH: 1.61 u[IU]/mL (ref 0.35–5.50)

## 2012-01-20 LAB — CHOLESTEROL, TOTAL: Cholesterol: 128 mg/dL (ref 0–200)

## 2012-01-21 ENCOUNTER — Encounter: Payer: Self-pay | Admitting: *Deleted

## 2012-01-23 LAB — AFB CULTURE WITH SMEAR (NOT AT ARMC): Acid Fast Smear: NONE SEEN

## 2012-01-28 ENCOUNTER — Telehealth: Payer: Self-pay | Admitting: Internal Medicine

## 2012-01-28 NOTE — Telephone Encounter (Signed)
LM ON TRIAGE LINE 9.17.13 @ 307PM PT stated she would like her lab results called into her from her 9.9.13 visit Cb# [phone]

## 2012-01-28 NOTE — Telephone Encounter (Signed)
Pt notified of norma results and that she will be getting a letter in mail stating the same thing.

## 2012-02-02 ENCOUNTER — Telehealth: Payer: Self-pay | Admitting: Internal Medicine

## 2012-02-02 NOTE — Telephone Encounter (Signed)
Patient left vm on triage line at 12:56p stating she would like to speak with someone regarding her rx for Lexapro. She would like to discuss some of the side effects and possibly changing the dosage. She can be reached at work [phone]. Ask for [facility].

## 2012-02-03 NOTE — Telephone Encounter (Signed)
Discussed with pt

## 2012-02-03 NOTE — Telephone Encounter (Signed)
Pt states she is having excessive sweating & feeling anxious. Please advise.

## 2012-02-03 NOTE — Telephone Encounter (Signed)
Increase sweats are sometimes seen with SSRIs. It may take up to 6 weeks for SSRIs to work. Plan: If the excess his sweats are a major problem, let me know and I will switch to another SSRI Is too early to increase the dose, need to wait at least 2 more weeks

## 2012-02-03 NOTE — Telephone Encounter (Signed)
Called pt, unable to leave a msg.   

## 2012-02-17 ENCOUNTER — Other Ambulatory Visit: Payer: Self-pay | Admitting: Internal Medicine

## 2012-02-17 MED ORDER — ESCITALOPRAM OXALATE 10 MG PO TABS: 10.0000 mg | ORAL_TABLET | Freq: Every day | ORAL | Status: DC

## 2012-02-17 NOTE — Telephone Encounter (Signed)
Refill done.  

## 2012-02-17 NOTE — Telephone Encounter (Signed)
Per call from patient called [facility] on [address] they told her she has no refills and needs to call dr office and have a new RX sent to them, they do not send refill requests for meds with no refills left.  Refill Escitalopram Oxalate (Tab) LEXAPRO 10 MG Take 1 tablet (10 mg total) by mouth daily--last wrt 9.9.13 #30 wt/1-refill last ov 9.9.13 V70/300.00  Pharmacy [id number] - [place], [place] - [address]

## 2012-03-01 ENCOUNTER — Ambulatory Visit: Payer: BC Managed Care – PPO | Admitting: Internal Medicine

## 2012-03-01 DIAGNOSIS — Z0289 Encounter for other administrative examinations: Secondary | ICD-10-CM

## 2012-03-18 ENCOUNTER — Ambulatory Visit (HOSPITAL_BASED_OUTPATIENT_CLINIC_OR_DEPARTMENT_OTHER)
Admission: RE | Admit: 2012-03-18 | Discharge: 2012-03-18 | Disposition: A | Discharge status: Home / Self Care | Payer: BC Managed Care – PPO | Source: Ambulatory Visit | Attending: Family Medicine | Admitting: Family Medicine

## 2012-03-18 ENCOUNTER — Telehealth (INDEPENDENT_AMBULATORY_CARE_PROVIDER_SITE_OTHER): Payer: Self-pay | Admitting: General Surgery

## 2012-03-18 ENCOUNTER — Encounter (HOSPITAL_BASED_OUTPATIENT_CLINIC_OR_DEPARTMENT_OTHER): Payer: Self-pay

## 2012-03-18 ENCOUNTER — Ambulatory Visit (INDEPENDENT_AMBULATORY_CARE_PROVIDER_SITE_OTHER): Payer: BC Managed Care – PPO | Admitting: Family Medicine

## 2012-03-18 ENCOUNTER — Telehealth (INDEPENDENT_AMBULATORY_CARE_PROVIDER_SITE_OTHER): Payer: Self-pay

## 2012-03-18 ENCOUNTER — Encounter: Payer: Self-pay | Admitting: Family Medicine

## 2012-03-18 VITALS — BP 108/76 | HR 73 | Temp 98.5°F | Resp 16 | Wt 161.0 lb

## 2012-03-18 DIAGNOSIS — N949 Unspecified condition associated with female genital organs and menstrual cycle: Secondary | ICD-10-CM

## 2012-03-18 DIAGNOSIS — R102 Pelvic and perineal pain: Secondary | ICD-10-CM

## 2012-03-18 DIAGNOSIS — N83209 Unspecified ovarian cyst, unspecified side: Secondary | ICD-10-CM | POA: Insufficient documentation

## 2012-03-18 DIAGNOSIS — R109 Unspecified abdominal pain: Secondary | ICD-10-CM | POA: Insufficient documentation

## 2012-03-18 MED ORDER — IOHEXOL 300 MG/ML  SOLN
100.0000 mL | Freq: Once | INTRAMUSCULAR | Status: AC | PRN
  Administered 2012-03-18: 100 mL via INTRAVENOUS

## 2012-03-18 NOTE — Progress Notes (Signed)
  Subjective:    Patient ID: Victoria Ross, female    DOB: 09/30/1990, 21 y.o.   MRN: [id number]  HPI Inguinal pain- had hernia repaired 8/1 on R and had 'large, infected lymph node removed'.  10-14 days ago developed worsening inguinal pain, fatigue, 'i just don't feel good'.  Pain is superior to scar.  Intermittent nausea.  No fevers.  Pain is worse w/ movement, particularly bending.  Attempted to call surgeon and was told to be seen by PCP.  Pain improves w/ lying down.   Review of Systems For ROS see HPI     Objective:   Physical Exam  Vitals reviewed. Constitutional: She is oriented to person, place, and time. She appears well-developed and well-nourished.       Obviously uncomfortable  Cardiovascular: Normal rate, regular rhythm, normal heart sounds and intact distal pulses.   Pulmonary/Chest: Effort normal and breath sounds normal. No respiratory distress. She has no wheezes. She has no rales.  Abdominal: Soft. Bowel sounds are normal. She exhibits no distension. There is tenderness (over R pelvis/LQ w/out obvious mass). There is guarding (both voluntary and involuntary). There is no rebound.  Neurological: She is alert and oriented to person, place, and time.  Psychiatric: She has a normal mood and affect. Her behavior is normal. Thought content normal.          Assessment & Plan:

## 2012-03-18 NOTE — Telephone Encounter (Signed)
Pt called for advice:  She had surgery 12/11/11 for inguinal hernia and lymph node harvest.  She has recently had some pain there and today is running a fever of 101 F.  Reassured pt that probability of fever being related to surgery is very low after three months.  Pain could be related to scar tissue formation.  Advised pt to call PCP for appt/ evaluation.  She understands and agrees.

## 2012-03-18 NOTE — Telephone Encounter (Signed)
Dr Rennis Golden nurse called stating they have seen pt in their office today and will be doing a CT due to rlq abd pain with guarding. If a surgical need or if Dr Beverely Low thinks it is hernia related they will call our office to have pt seen asap. I advised her I will send msg to Dr Donell Beers and her assistant so they will be aware to watch for Ct report.

## 2012-03-18 NOTE — Patient Instructions (Addendum)
We'll notify you of your CT scan and lab results Try and be still to avoid pain but if the pain gets worse- go to the ER Call with any questions or concerns Hang in there!!

## 2012-03-19 LAB — CBC WITH DIFFERENTIAL/PLATELET
Basophils Relative: 0.2 % (ref 0.0–3.0)
Eosinophils Absolute: 0.1 10*3/uL (ref 0.0–0.7)
Eosinophils Relative: 2 % (ref 0.0–5.0)
Lymphocytes Relative: 45.3 % (ref 12.0–46.0)
Monocytes Relative: 6.2 % (ref 3.0–12.0)
Neutrophils Relative %: 46.3 % (ref 43.0–77.0)
RBC: 4.47 Mil/uL (ref 3.87–5.11)
WBC: 6.8 10*3/uL (ref 4.5–10.5)

## 2012-03-19 LAB — BASIC METABOLIC PANEL
BUN: 11 mg/dL (ref 6–23)
Chloride: 104 mEq/L (ref 96–112)
Glucose, Bld: 72 mg/dL (ref 70–99)
Potassium: 3.8 mEq/L (ref 3.5–5.1)

## 2012-03-19 MED ORDER — HYDROCODONE-ACETAMINOPHEN 5-500 MG PO TABS: 1.0000 | ORAL_TABLET | Freq: Four times a day (QID) | ORAL | Status: DC | PRN

## 2012-03-19 NOTE — Addendum Note (Signed)
Addended by: Jackson Latino on: 03/19/2012 10:02 AM   Modules accepted: Orders

## 2012-03-19 NOTE — Assessment & Plan Note (Signed)
New.  Pt obviously uncomfortable.  Concerning since she has had recent surgical procedure.  Called surgery office to ask which type of imaging they preferred- CT vs [place].  They recommend CT to assess.  Check CBC and BMP.  Get CT.  Will make appropriate referrals/tx plan pending outcome of CT.  Pt expressed understanding and is in agreement w/ plan.

## 2012-03-26 ENCOUNTER — Ambulatory Visit: Payer: BC Managed Care – PPO | Admitting: Internal Medicine

## 2012-03-26 ENCOUNTER — Ambulatory Visit: Payer: Self-pay | Admitting: Internal Medicine

## 2012-03-29 ENCOUNTER — Encounter: Payer: Self-pay | Admitting: Internal Medicine

## 2012-03-29 ENCOUNTER — Ambulatory Visit (INDEPENDENT_AMBULATORY_CARE_PROVIDER_SITE_OTHER): Payer: BC Managed Care – PPO | Admitting: Internal Medicine

## 2012-03-29 VITALS — BP 122/82 | HR 57 | Temp 98.0°F | Wt 161.0 lb

## 2012-03-29 DIAGNOSIS — N949 Unspecified condition associated with female genital organs and menstrual cycle: Secondary | ICD-10-CM

## 2012-03-29 DIAGNOSIS — F329 Major depressive disorder, single episode, unspecified: Secondary | ICD-10-CM

## 2012-03-29 DIAGNOSIS — R102 Pelvic and perineal pain: Secondary | ICD-10-CM

## 2012-03-29 DIAGNOSIS — F419 Anxiety disorder, unspecified: Secondary | ICD-10-CM

## 2012-03-29 DIAGNOSIS — F341 Dysthymic disorder: Secondary | ICD-10-CM

## 2012-03-29 MED ORDER — ESCITALOPRAM OXALATE 10 MG PO TABS: 10.0000 mg | ORAL_TABLET | Freq: Every day | ORAL | Status: DC

## 2012-03-29 NOTE — Assessment & Plan Note (Signed)
Reports she feels really well, "is perfect" No side effects. Plan:  Refill medicines, follow up next year. We'll re-evaluate the need for medication yearly.

## 2012-03-29 NOTE — Assessment & Plan Note (Signed)
Was recently seen with lower abdominal pain, CT was negative for acute processes. Pain was felt to be due to ovarian cyst, currently is pain-free.

## 2012-03-29 NOTE — Progress Notes (Signed)
  Subjective:    Patient ID: Victoria Ross, female    DOB: [birth year], 21 y.o.   MRN: [id number]  HPI Followup from previous visit She was started on Lexapro, good compliance and tolerance. Reports she is doing great, she is able to focus more, looking back she realized that she was irritable and anxious.  Past Medical History  Diagnosis Date  . Seizures     last 2009  . Fracture dislocation of knee joint     rt medial plauteau  may 2013  . Anxiety and depression 01/19/2012   Past Surgical History  Procedure Date  . Inguinal hernia repair 12/11/2011    Procedure: HERNIA REPAIR INGUINAL ADULT;  Surgeon: Almond Lint, MD;  Location: [facility];  Service: General;  Laterality: Right;  inguinal node biopsy     Review of Systems Had some nausea only when took Lexapro in the morning, and now is taking it at midmorning and feels well. Was recently seen with abdominal pain, chart reviewed, CT was negative except for ovarian cysts. She discuss with gynecology, they are planning to do a ultrasound  next month. She is currently pain-free. Denies any vomiting, diarrhea or suicidal ideas.    Objective:   Physical Exam General -- alert, well-developed . No apparent distress.   Psych-- Cognition and judgment appear intact. Alert and cooperative with normal attention span and concentration.  not anxious appearing and not depressed appearing.      Assessment & Plan:

## 2012-04-07 ENCOUNTER — Other Ambulatory Visit: Payer: Self-pay | Admitting: *Deleted

## 2012-04-07 NOTE — Telephone Encounter (Signed)
Can't call Antibiotics over the phone. In addition to rest, fluids, Tylenol, Mucinex, recommend to go to the urgent care if she is getting worse. We could see her Friday if needed.

## 2012-04-07 NOTE — Telephone Encounter (Signed)
Discussed with pt. She states she is going to try rest, fluids, tylenol, & musinex & if she's not better by Friday then she will call for an appt.

## 2012-04-07 NOTE — Telephone Encounter (Signed)
Pt would like to know if we will be able to call in this medication for her. Please advise.

## 2012-04-07 NOTE — Telephone Encounter (Signed)
Pt called in stating she has been around her 3 siblings who have been dx with strep throat & bronchitis. Pt states she has fever, sore throat, & fluid in right ear. Pt would like to have an abx called into her pharmacy. Please advise.

## 2012-06-03 ENCOUNTER — Other Ambulatory Visit (INDEPENDENT_AMBULATORY_CARE_PROVIDER_SITE_OTHER): Payer: BC Managed Care – PPO

## 2012-06-03 ENCOUNTER — Telehealth: Payer: Self-pay | Admitting: *Deleted

## 2012-06-03 DIAGNOSIS — N390 Urinary tract infection, site not specified: Secondary | ICD-10-CM

## 2012-06-03 LAB — POCT URINALYSIS DIPSTICK
Bilirubin, UA: NEGATIVE
Glucose, UA: NEGATIVE
Ketones, UA: NEGATIVE
Nitrite, UA: NEGATIVE
Spec Grav, UA: 1.02

## 2012-06-03 NOTE — Telephone Encounter (Signed)
Discussed with Dr Drue Novel.  The pt is coming to leave a sample, pt also stated that she is not pregnant.

## 2012-06-03 NOTE — Telephone Encounter (Signed)
Pt called stating that she feels like she has a UTI. Pt states that she has blood in her urine, cloudy, pain in lower back & abdomen. Pt stated that she does not have enough money to come in for an OV but would like to know if she can come by & leave a specimen. Please advise.

## 2012-06-04 ENCOUNTER — Telehealth: Payer: Self-pay | Admitting: Internal Medicine

## 2012-06-04 ENCOUNTER — Encounter: Payer: Self-pay | Admitting: *Deleted

## 2012-06-04 MED ORDER — NITROFURANTOIN MONOHYD MACRO 100 MG PO CAPS: 100.0000 mg | ORAL_CAPSULE | Freq: Two times a day (BID) | ORAL | Status: DC

## 2012-06-04 NOTE — Telephone Encounter (Signed)
Refill done.  

## 2012-06-04 NOTE — Telephone Encounter (Signed)
Patient would like to know if Dr. Drue Novel will give her medication for her UTI.

## 2012-06-04 NOTE — Telephone Encounter (Signed)
The u-dip results are in the computer but not the culture. Please advise.

## 2012-06-04 NOTE — Telephone Encounter (Signed)
U dip positive, culture pending Call Macrobid 100 mg one by mouth twice a day for 5 days, #10, no RF

## 2012-06-06 LAB — URINE CULTURE: Colony Count: >=100000

## 2012-07-22 ENCOUNTER — Encounter: Payer: Self-pay | Admitting: Internal Medicine

## 2012-07-22 ENCOUNTER — Ambulatory Visit (INDEPENDENT_AMBULATORY_CARE_PROVIDER_SITE_OTHER): Payer: BC Managed Care – PPO | Admitting: Internal Medicine

## 2012-07-22 VITALS — BP 100/78 | HR 84 | Temp 98.0°F | Wt 164.0 lb

## 2012-07-22 DIAGNOSIS — J322 Chronic ethmoidal sinusitis: Secondary | ICD-10-CM

## 2012-07-22 MED ORDER — AMOXICILLIN 500 MG PO CAPS: 1000.0000 mg | ORAL_CAPSULE | Freq: Two times a day (BID) | ORAL | Status: DC

## 2012-07-22 NOTE — Patient Instructions (Addendum)
Rest, fluids , tylenol For cough, take Mucinex DM twice a day as needed  For congestion use behind the counter sudafed (pseudoephedrin) 30 mg 3 times a day as needed for the next week Take the antibiotic as prescribed  (Amoxicillin) Call if no better in few days Call anytime if the symptoms are severe  ---- In addition to aveeno, use OTC hydrocortisone 1% cream at the hands sporadically and as needed

## 2012-07-22 NOTE — Progress Notes (Signed)
  Subjective:    Patient ID: Victoria Ross, female    DOB: August 20, 1990, 22 y.o.   MRN: [id number]  HPI Acute visit Symptoms started a week ago: Headache, right ear pain, postnasal dripping which is causing cough, green nasal discharge. Also, her hands are quite dry and crackly, this is a second winter this happens, she is using diligently Aveeno and other OTC moisturizers with mild help  only.  Past Medical History  Diagnosis Date  . Seizures     last 2009  . Fracture dislocation of knee joint     rt medial plauteau  may 2013  . Anxiety and depression 01/19/2012   Past Surgical History  Procedure Laterality Date  . Inguinal hernia repair  12/11/2011    Procedure: HERNIA REPAIR INGUINAL ADULT;  Surgeon: Almond Lint, MD;  Location: [facility];  Service: General;  Laterality: Right;  inguinal node biopsy     Review of Systems Denies fever or chills No nausea, vomiting, diarrhea. No myalgias, no sore throat. A coworker had similar symptoms    Objective:   Physical Exam  General -- alert, well-developed  HEENT -- TMs normal, mild pain at the R ear canal on exam but no swelling or d/c; throat w/o redness, face symmetric and moderately tender to palpation at the maxillary sinuses L>R, nose quite congested. Lungs -- normal respiratory effort, no intercostal retractions, no accessory muscle use, and normal breath sounds.   Heart-- normal rate, regular rhythm, no murmur, and no gallop.   Hands: Skin in the knuckles moderately dry  Neurologic-- alert & oriented X3 and strength normal in all extremities. Psych-- Cognition and judgment appear intact. Alert and cooperative with normal attention span and concentration.  not anxious appearing and not depressed appearing.          Assessment & Plan:   Symptoms consistent with sinusitis, see instructions Very dry skin in her hands, recommend to continue with Aveeno, avoid hot water when she washed her hands, judicious use of hydrocortisone  cream.

## 2012-08-18 ENCOUNTER — Telehealth: Payer: Self-pay | Admitting: Internal Medicine

## 2012-08-18 NOTE — Telephone Encounter (Signed)
Please arrange a visit. 

## 2012-08-18 NOTE — Telephone Encounter (Signed)
Spoke with pt, scheduled appt Friday @ 330p.

## 2012-08-18 NOTE — Telephone Encounter (Signed)
Pt called in and stated that for the last few weeks she feels med is not working as good as she thinks it should. She is experiencing more anxiety and yelling at people. She really needs to talk with you to figure out if she just should up her meds or come in and see P[facility].

## 2012-08-18 NOTE — Telephone Encounter (Signed)
Should pt's lexapro dosage be increased or does she need to schedule an OV? Please advise.

## 2012-08-20 ENCOUNTER — Ambulatory Visit (INDEPENDENT_AMBULATORY_CARE_PROVIDER_SITE_OTHER): Payer: BC Managed Care – PPO | Admitting: Internal Medicine

## 2012-08-20 ENCOUNTER — Encounter: Payer: Self-pay | Admitting: Internal Medicine

## 2012-08-20 VITALS — BP 110/80 | HR 57 | Wt 164.0 lb

## 2012-08-20 DIAGNOSIS — F341 Dysthymic disorder: Secondary | ICD-10-CM

## 2012-08-20 DIAGNOSIS — F419 Anxiety disorder, unspecified: Secondary | ICD-10-CM

## 2012-08-20 MED ORDER — ESCITALOPRAM OXALATE 20 MG PO TABS: 20.0000 mg | ORAL_TABLET | Freq: Every day | ORAL | Status: DC

## 2012-08-20 NOTE — Patient Instructions (Addendum)
Next visit 6 months if you are doing well, otherwise please call

## 2012-08-20 NOTE — Assessment & Plan Note (Signed)
See previous entries, on Lexapro since 01-2012, symptoms well controlled except for irritability which could be from anxiety. Plan: Increase Lexapro from 10 mg to 20 mg. Encouraged her to stay active (she just joined the gym). Also discussed with her considering going back to counseling. If the  new Lexapro 20 mg works for her, return to the office by September 2014 otherwise call in 4-6 weeks.

## 2012-08-20 NOTE — Progress Notes (Signed)
  Subjective:    Patient ID: Victoria Ross, female    DOB: 05/16/1990, 22 y.o.   MRN: [id number]  HPI Here for eval of anxiety and depression. Increased medication dose?Marland Kitchen She was doing well up until 2 weeks ago when she noted she was quite irritable,  "snappy" at work at home. She thinks is related to stress.  Past Medical History  Diagnosis Date  . Seizures     last 2009  . Fracture dislocation of knee joint     rt medial plauteau  may 2013  . Anxiety and depression 01/19/2012  . Contraception     None   Past Surgical History  Procedure Laterality Date  . Inguinal hernia repair  12/11/2011    Procedure: HERNIA REPAIR INGUINAL ADULT;  Surgeon: Almond Lint, MD;  Location: [facility];  Service: General;  Laterality: Right;  inguinal node biopsy   History   Social History  . Marital Status: Single    Spouse Name: N/A    Number of Children: 0  . Years of Education: N/A   Occupational History  . works FT, Network engineer    Social History Main Topics  . Smoking status: Current Every Day Smoker -- 0.50 packs/day for 4 years    Types: Cigarettes  . Smokeless tobacco: Never Used  . Alcohol Use: 0.6 oz/week    1 Shots of liquor per week     Comment: socially  . Drug Use: No  . Sexually Active: Yes    Birth Control/ Protection: None   Other Topics Concern  . Not on file   Social History Narrative  . No narrative on file     Review of Systems She is very busy at work but she does  like her Job and  she feels appreciated. Things at home are well, she lives with her boyfriend. She goes to college online, mostly during the weekends. Sleeping okay, no problems with ADHD type of symptoms. Does not feel constantly anxious or depressed.    Objective:   Physical Exam General -- alert, well-developed, no apparent distress   Neurologic-- alert & oriented X3 and strength normal in all extremities. Psych-- Cognition and judgment appear intact. Alert and cooperative with normal  attention span and concentration.  not anxious appearing and not depressed appearing.      Assessment & Plan:

## 2012-08-21 ENCOUNTER — Encounter: Payer: Self-pay | Admitting: Internal Medicine

## 2012-10-25 ENCOUNTER — Ambulatory Visit: Payer: BC Managed Care – PPO | Admitting: Internal Medicine

## 2012-10-25 ENCOUNTER — Ambulatory Visit: Payer: BC Managed Care – PPO | Admitting: Diagnostic Neuroimaging

## 2012-11-09 ENCOUNTER — Ambulatory Visit: Payer: BC Managed Care – PPO | Admitting: Family Medicine

## 2013-01-17 ENCOUNTER — Telehealth: Payer: Self-pay | Admitting: Internal Medicine

## 2013-01-17 NOTE — Telephone Encounter (Signed)
Patient called stating she is taking Lexapro and dosage was increased from 10 mg to 20 mg and has had what she believes are 2 panic attacks in the past 2 weeks. People told her she was overwhelmed and frustrated easily. She would like to know what she needs to do. She states she does not have money for a copay.

## 2013-01-17 NOTE — Telephone Encounter (Signed)
She is taking a very good dose of Lexapro, she still has symptoms next step would be counseling and/or consult psychiatry. since is hard for her to see providers, we can try  clonazepam 0.5 mg one by mouth twice a day when necessary anxiety, #30, no refills. Please be sure the patient knows she cannot take that medication if pregnant. Schedule office visit with me at her earliest convenience.

## 2013-01-17 NOTE — Telephone Encounter (Signed)
Please advise 

## 2013-01-18 MED ORDER — CLONAZEPAM 0.5 MG PO TABS: 0.5000 mg | ORAL_TABLET | Freq: Two times a day (BID) | ORAL | Status: DC | PRN

## 2013-01-18 NOTE — Telephone Encounter (Signed)
Called and LMOVM for pt to return call.  

## 2013-01-18 NOTE — Telephone Encounter (Signed)
Pt would like to try the clonazepam. Rx printed and placed for [employer]. Needs faxed to [facility] on [address]. Pt will let us know how med is working and schedule an appt.

## 2013-01-28 ENCOUNTER — Encounter: Payer: BC Managed Care – PPO | Admitting: Internal Medicine

## 2013-07-04 ENCOUNTER — Encounter: Payer: Self-pay | Admitting: Physician Assistant

## 2013-07-04 ENCOUNTER — Telehealth: Payer: Self-pay | Admitting: Physician Assistant

## 2013-07-04 ENCOUNTER — Ambulatory Visit (INDEPENDENT_AMBULATORY_CARE_PROVIDER_SITE_OTHER): Payer: BC Managed Care – PPO | Admitting: Physician Assistant

## 2013-07-04 ENCOUNTER — Telehealth: Payer: Self-pay | Admitting: *Deleted

## 2013-07-04 VITALS — BP 122/68 | HR 84 | Temp 98.1°F | Wt 168.0 lb

## 2013-07-04 DIAGNOSIS — R1031 Right lower quadrant pain: Secondary | ICD-10-CM

## 2013-07-04 DIAGNOSIS — R1903 Right lower quadrant abdominal swelling, mass and lump: Secondary | ICD-10-CM

## 2013-07-04 DIAGNOSIS — R1909 Other intra-abdominal and pelvic swelling, mass and lump: Secondary | ICD-10-CM

## 2013-07-04 LAB — CBC WITH DIFFERENTIAL/PLATELET
BASOS PCT: 0.3 % (ref 0.0–3.0)
Basophils Absolute: 0 10*3/uL (ref 0.0–0.1)
EOS ABS: 0.1 10*3/uL (ref 0.0–0.7)
EOS PCT: 1.9 % (ref 0.0–5.0)
HEMATOCRIT: 39.8 % (ref 36.0–46.0)
Hemoglobin: 12.9 g/dL (ref 12.0–15.0)
LYMPHS ABS: 3.3 10*3/uL (ref 0.7–4.0)
Lymphocytes Relative: 43.1 % (ref 12.0–46.0)
MCHC: 32.5 g/dL (ref 30.0–36.0)
MCV: 91.1 fl (ref 78.0–100.0)
MONO ABS: 0.5 10*3/uL (ref 0.1–1.0)
Monocytes Relative: 6.9 % (ref 3.0–12.0)
NEUTROS PCT: 47.8 % (ref 43.0–77.0)
Neutro Abs: 3.6 10*3/uL (ref 1.4–7.7)
PLATELETS: 229 10*3/uL (ref 150.0–400.0)
RBC: 4.36 Mil/uL (ref 3.87–5.11)
RDW: 12.5 % (ref 11.5–14.6)
WBC: 7.6 10*3/uL (ref 4.5–10.5)

## 2013-07-04 LAB — BASIC METABOLIC PANEL
BUN: 13 mg/dL (ref 6–23)
CALCIUM: 9.7 mg/dL (ref 8.4–10.5)
CO2: 27 mEq/L (ref 19–32)
Chloride: 103 mEq/L (ref 96–112)
Creatinine, Ser: 0.8 mg/dL (ref 0.4–1.2)
GFR: 96.27 mL/min (ref >60.00)
GLUCOSE: 83 mg/dL (ref 70–99)
POTASSIUM: 3.4 meq/L — AB (ref 3.5–5.1)
SODIUM: 137 meq/L (ref 135–145)

## 2013-07-04 NOTE — Telephone Encounter (Signed)
Attempted to contact pt, left message on voice mail to return my call regarding additional information from today's appt.

## 2013-07-04 NOTE — Progress Notes (Signed)
Patient presents to clinic today c/o several weeks of dull, aching pain in RLQ.  Patient is concerned because she has a history or R femoral hernia requiring surgical repair. Patient endorses palpable mass with standing. Denies nausea, vomiting, fever, chills.  Denies change in bowel or bladder habitus.  Is currently on her menstrual period.    Past Medical History  Diagnosis Date  . Seizures     last 2009  . Fracture dislocation of knee joint     rt medial plauteau  may 2013  . Anxiety and depression 01/19/2012  . Contraception     None    Current Outpatient Prescriptions on File Prior to Visit  Medication Sig Dispense Refill  . clonazePAM (KLONOPIN) 0.5 MG tablet Take 1 tablet (0.5 mg total) by mouth 2 (two) times daily as needed for anxiety.  30 tablet  0   No current facility-administered medications on file prior to visit.    Allergies  Allergen Reactions  . Tramadol Hives and Nausea And Vomiting    Family History  Problem Relation Age of Onset  . Adopted: Yes    History   Social History  . Marital Status: Single    Spouse Name: N/A    Number of Children: 0  . Years of Education: N/A   Occupational History  . works FT, [employer]    Social History Main Topics  . Smoking status: Current Every Day Smoker -- 0.50 packs/day for 4 years    Types: Cigarettes  . Smokeless tobacco: Never Used  . Alcohol Use: 0.6 oz/week    1 Shots of liquor per week     Comment: socially  . Drug Use: No  . Sexual Activity: Yes    Birth Control/ Protection: None   Other Topics Concern  . None   Social History Narrative  . None   Review of Systems - See HPI.  All other ROS are negative.  BP 122/68  Pulse 84  Temp(Src) 98.1 F (36.7 C) (Oral)  Wt 168 lb (76.204 kg)  SpO2 100%  LMP 06/25/2013  Physical Exam  Vitals reviewed. Constitutional: She is oriented to person, place, and time and well-developed, well-nourished, and in no distress.  HENT:  Head: Normocephalic  and atraumatic.  Right Ear: External ear normal.  Left Ear: External ear normal.  Nose: Nose normal.  Mouth/Throat: Oropharynx is clear and moist. No oropharyngeal exudate.  Eyes: Conjunctivae are normal. Pupils are equal, round, and reactive to light.  Neck: Neck supple.  Cardiovascular: Normal rate, regular rhythm, normal heart sounds and intact distal pulses.   Pulmonary/Chest: Effort normal and breath sounds normal. No respiratory distress. She has no wheezes. She has no rales. She exhibits no tenderness.  Abdominal: Bowel sounds are normal. There is no hepatosplenomegaly. There is tenderness in the right lower quadrant. There is no tenderness at McBurney's point and negative Murphy's sign.  Palpable, slightly tender mass noted on R inguinal region.  Does not feel consistent with hernia, possible lymph node.  Lymphadenopathy:    She has no cervical adenopathy.  Neurological: She is alert and oriented to person, place, and time.  Skin: Skin is warm and dry. No rash noted.  Psychiatric: Affect normal.    Recent Results (from the past 2160 hour(s))  CBC WITH DIFFERENTIAL     Status: None   Collection Time    07/04/13  1:49 PM      Result Value Ref Range   WBC 7.6  4.5 - 10.5  K/uL   RBC 4.36  3.87 - 5.11 Mil/uL   Hemoglobin 12.9  12.0 - 15.0 g/dL   HCT 96.0  45.4 - 09.8 %   MCV 91.1  78.0 - 100.0 fl   MCHC 32.5  30.0 - 36.0 g/dL   RDW 11.9  14.7 - 82.9 %   Platelets 229.0  150.0 - 400.0 K/uL   Neutrophils Relative % 47.8  43.0 - 77.0 %   Lymphocytes Relative 43.1  12.0 - 46.0 %   Monocytes Relative 6.9  3.0 - 12.0 %   Eosinophils Relative 1.9  0.0 - 5.0 %   Basophils Relative 0.3  0.0 - 3.0 %   Neutro Abs 3.6  1.4 - 7.7 K/uL   Lymphs Abs 3.3  0.7 - 4.0 K/uL   Monocytes Absolute 0.5  0.1 - 1.0 K/uL   Eosinophils Absolute 0.1  0.0 - 0.7 K/uL   Basophils Absolute 0.0  0.0 - 0.1 K/uL  BASIC METABOLIC PANEL     Status: Abnormal   Collection Time    07/04/13  1:49 PM      Result  Value Ref Range   Sodium 137  135 - 145 mEq/L   Potassium 3.4 (*) 3.5 - 5.1 mEq/L   Chloride 103  96 - 112 mEq/L   CO2 27  19 - 32 mEq/L   Glucose, Bld 83  70 - 99 mg/dL   BUN 13  6 - 23 mg/dL   Creatinine, Ser 0.8  0.4 - 1.2 mg/dL   Calcium 9.7  8.4 - 56.2 mg/dL   GFR 13.08  >65.78 mL/min    Assessment/Plan: Mass of right inguinal region Scheduled with [facility] for evaluation.  Pelvic US ordered.  Will obtain CBC, BMP.  Alternate tylenol and Ibuprofen for pain.  Patient educated on alarm signs/symptoms and when to proceed to the ER.

## 2013-07-04 NOTE — Patient Instructions (Signed)
Please go to lab.  I will call you with your results.  Please stop by the front desk to speak with "GrenadaBrittany" about an appointment with general surgery.  Increase fluid intake.  Take a fiber supplement and stool softener to help with constipation.  If you develop severe pain, nausea or vomiting before your surgery appointment, please proceed to the ER.

## 2013-07-04 NOTE — Telephone Encounter (Signed)
Called pts home number left message on voice mail to advise that US appt was tomorrow 07/05/13 at 3:30.

## 2013-07-04 NOTE — Telephone Encounter (Signed)
Please inform patient that we will need to obtain imaging prior to her appointment with [facility].  The surgeon recommended we start with an Ultrasound giving likelihood of recurrent hernia in this patient is relatively low. I have placed an order for an abdominal ultrasound.  Patient will be contacted by imaging department.

## 2013-07-05 ENCOUNTER — Telehealth (INDEPENDENT_AMBULATORY_CARE_PROVIDER_SITE_OTHER): Payer: Self-pay

## 2013-07-05 ENCOUNTER — Other Ambulatory Visit (INDEPENDENT_AMBULATORY_CARE_PROVIDER_SITE_OTHER): Payer: Self-pay | Admitting: General Surgery

## 2013-07-05 ENCOUNTER — Ambulatory Visit (HOSPITAL_BASED_OUTPATIENT_CLINIC_OR_DEPARTMENT_OTHER): Payer: BC Managed Care – PPO

## 2013-07-05 ENCOUNTER — Telehealth: Payer: Self-pay | Admitting: Internal Medicine

## 2013-07-05 DIAGNOSIS — K4190 Unilateral femoral hernia, without obstruction or gangrene, not specified as recurrent: Secondary | ICD-10-CM

## 2013-07-05 NOTE — Telephone Encounter (Signed)
Relevant patient education assigned to patient using Emmi. ° °

## 2013-07-05 NOTE — Telephone Encounter (Signed)
Pt's US cx'd by PCP.  Rescheduled for 07/06/13 at 9:00 a.m., [facility].  Pt is aware.

## 2013-07-06 ENCOUNTER — Telehealth (INDEPENDENT_AMBULATORY_CARE_PROVIDER_SITE_OTHER): Payer: Self-pay | Admitting: *Deleted

## 2013-07-06 ENCOUNTER — Other Ambulatory Visit (INDEPENDENT_AMBULATORY_CARE_PROVIDER_SITE_OTHER): Payer: Self-pay | Admitting: General Surgery

## 2013-07-06 ENCOUNTER — Ambulatory Visit
Admission: RE | Admit: 2013-07-06 | Discharge: 2013-07-06 | Disposition: A | Discharge status: Home / Self Care | Payer: BC Managed Care – PPO | Source: Ambulatory Visit | Attending: General Surgery | Admitting: General Surgery

## 2013-07-06 ENCOUNTER — Other Ambulatory Visit (INDEPENDENT_AMBULATORY_CARE_PROVIDER_SITE_OTHER): Payer: Self-pay | Admitting: *Deleted

## 2013-07-06 DIAGNOSIS — K4190 Unilateral femoral hernia, without obstruction or gangrene, not specified as recurrent: Secondary | ICD-10-CM

## 2013-07-06 NOTE — Telephone Encounter (Signed)
Patient called to ask if there is anyway that the US results can be reviewed prior to her coming in.  Patient states that if the US doesn't show anything then does she really need to waste Dr. Arita MissByerly's time by coming in.  Explained that I would send a message to Dr. Donell BeersByerly to ask if she wants her to come in to receive the results or whether we can give her the results via phone and cancel her appt.  Patient states understanding and agreeable at this time.

## 2013-07-06 NOTE — Telephone Encounter (Signed)
Victoria Ross called from [facility] to state that she does not think the US Abdominal is the correct order for a femoral hernia.  I spoke to Dr. Donell BeersByerly by telephone who states that is correct she needs a groin US.  Order changed at this time to a US Pelvic Limited which is what Victoria Ross suggested to look for a femoral hernia vs lymph per Dr. Arita MissByerly's request.  Victoria Ross aware of the change in order and agreeable with this order.

## 2013-07-06 NOTE — Telephone Encounter (Signed)
The ultrasound shows a 6 mm node, but no hernia.   I am happy to examine her bulge/knot to assess  Hernia does not always show up on ultrasound.   I will leave it up to her.  I cannot definitively give her reassurance without exam.  I do not consider it a waste of my time to see her and examine her, but if she does not want to come in, I am OK with that.

## 2013-07-08 ENCOUNTER — Other Ambulatory Visit (INDEPENDENT_AMBULATORY_CARE_PROVIDER_SITE_OTHER): Payer: Self-pay | Admitting: General Surgery

## 2013-07-08 ENCOUNTER — Encounter (INDEPENDENT_AMBULATORY_CARE_PROVIDER_SITE_OTHER): Payer: BC Managed Care – PPO | Admitting: General Surgery

## 2013-07-08 ENCOUNTER — Ambulatory Visit (HOSPITAL_BASED_OUTPATIENT_CLINIC_OR_DEPARTMENT_OTHER)
Admission: RE | Admit: 2013-07-08 | Discharge: 2013-07-08 | Disposition: A | Discharge status: Home / Self Care | Payer: BC Managed Care – PPO | Source: Ambulatory Visit | Attending: General Surgery | Admitting: General Surgery

## 2013-07-08 ENCOUNTER — Ambulatory Visit (INDEPENDENT_AMBULATORY_CARE_PROVIDER_SITE_OTHER): Payer: BC Managed Care – PPO | Admitting: General Surgery

## 2013-07-08 ENCOUNTER — Encounter (INDEPENDENT_AMBULATORY_CARE_PROVIDER_SITE_OTHER): Payer: Self-pay | Admitting: General Surgery

## 2013-07-08 ENCOUNTER — Telehealth (INDEPENDENT_AMBULATORY_CARE_PROVIDER_SITE_OTHER): Payer: Self-pay

## 2013-07-08 VITALS — BP 110/78 | HR 80 | Temp 98.6°F | Resp 16 | Ht 65.0 in | Wt 165.2 lb

## 2013-07-08 DIAGNOSIS — R1909 Other intra-abdominal and pelvic swelling, mass and lump: Secondary | ICD-10-CM | POA: Insufficient documentation

## 2013-07-08 DIAGNOSIS — R1031 Right lower quadrant pain: Secondary | ICD-10-CM | POA: Insufficient documentation

## 2013-07-08 DIAGNOSIS — R52 Pain, unspecified: Secondary | ICD-10-CM

## 2013-07-08 MED ORDER — IOHEXOL 300 MG/ML  SOLN
100.0000 mL | Freq: Once | INTRAMUSCULAR | Status: AC | PRN
  Administered 2013-07-08: 100 mL via INTRAVENOUS

## 2013-07-08 NOTE — Assessment & Plan Note (Addendum)
I do not think the patient has appendicitis, but I am concerned about the acute worsening of her pain in the last 24 hours and the fact that she has tenderness to palpation.  I do not feel a hernia or a pathologic lymph node.    Follow up to be dependent on results of scan. I suspect that this is GYN in origin.  Other possibilities are appendicitis epiploica, meckel's diverticulitis. Per the patient, she had normal labs at her PCP's office this week.

## 2013-07-08 NOTE — Progress Notes (Signed)
Chief Complaint  Patient presents with  . RLQ abdominal pain    HISTORY: Pt is here to evaluate for RLQ pain, and questionable bulge in RLQ.  She has had some pain for 2 weeks, with progressive worsening, especially in the last 24 hours.  She denies fevers/ chills.  She has not had change in her bowel habits.  She is finishing up her period now.  She did have an episode around 2 weeks ago where she carried over a lot of heavy things to a friend's house, but she did not have acute pain at that time.  She has no sick contacts.  She has had a pelvic ultrasound this week to look for LAD/hernia, and this was negative.    Past Medical History  Diagnosis Date  . Seizures     last 2009  . Fracture dislocation of knee joint     rt medial plauteau  may 2013  . Anxiety and depression 01/19/2012  . Contraception     None    Past Surgical History  Procedure Laterality Date  . Inguinal hernia repair  12/11/2011    Procedure: HERNIA REPAIR INGUINAL ADULT;  Surgeon: Almond Lint, MD;  Location: [facility];  Service: General;  Laterality: Right;  inguinal node biopsy    Current Outpatient Prescriptions  Medication Sig Dispense Refill  . clonazePAM (KLONOPIN) 0.5 MG tablet Take 1 tablet (0.5 mg total) by mouth 2 (two) times daily as needed for anxiety.  30 tablet  0   No current facility-administered medications for this visit.     Allergies  Allergen Reactions  . Tramadol Hives and Nausea And Vomiting     Family History  Problem Relation Age of Onset  . Adopted: Yes     History   Social History  . Marital Status: Single    Spouse Name: N/A    Number of Children: 0  . Years of Education: N/A   Occupational History  . works FT, Network engineer    Social History Main Topics  . Smoking status: Current Every Day Smoker -- 0.50 packs/day for 4 years    Types: Cigarettes  . Smokeless tobacco: Never Used  . Alcohol Use: 0.6 oz/week    1 Shots of liquor per week     Comment: socially  .  Drug Use: No  . Sexual Activity: Yes    Birth Control/ Protection: None   Other Topics Concern  . None   Social History Narrative  . None     REVIEW OF SYSTEMS - PERTINENT POSITIVES ONLY: 12 point review of systems negative other than HPI and PMH except for abdominal pain, constipation, nausea, period, weakness.   EXAM: Filed Vitals:   07/08/13 1222  BP: 110/78  Pulse: 80  Temp: 98.6 F (37 C)  Resp: 16    Wt Readings from Last 3 Encounters:  07/08/13 165 lb 3.2 oz (74.934 kg)  07/04/13 168 lb (76.204 kg)  08/20/12 164 lb (74.39 kg)     Gen:  No acute distress.  Well nourished and well groomed.   Neurological: Alert and oriented to person, place, and time. Coordination normal.  Head: Normocephalic and atraumatic.  Eyes: Conjunctivae are normal. Pupils are equal, round, and reactive to light. No scleral icterus.  Neck: Normal range of motion. Neck supple. No tracheal deviation or thyromegaly present.  Cardiovascular: Normal rate, regular rhythm, normal distal pulses. Respiratory: Effort normal.  No respiratory distress. No chest wall tenderness.  GI: Soft. Bowel sounds are  normal. The abdomen is soft and nondistended.  She has tenderness in the RLQ.  There is no hernia palpable, no LAD.    Musculoskeletal: Normal range of motion. Extremities are nontender.  Lymphadenopathy: No cervical, preauricular, postauricular or axillary adenopathy is present Skin: Skin is warm and dry. No rash noted. No diaphoresis. No erythema. No pallor. No clubbing, cyanosis, or edema.   Psychiatric: Normal mood and affect. Behavior is normal. Judgment and thought content normal.    LABORATORY RESULTS: Available labs are reviewed   Recent Results (from the past 2160 hour(s))  CBC WITH DIFFERENTIAL     Status: None   Collection Time    07/04/13  1:49 PM      Result Value Ref Range   WBC 7.6  4.5 - 10.5 K/uL   RBC 4.36  3.87 - 5.11 Mil/uL   Hemoglobin 12.9  12.0 - 15.0 g/dL   HCT 16.139.8   09.636.0 - 04.546.0 %   MCV 91.1  78.0 - 100.0 fl   MCHC 32.5  30.0 - 36.0 g/dL   RDW 40.912.5  81.111.5 - 91.414.6 %   Platelets 229.0  150.0 - 400.0 K/uL   Neutrophils Relative % 47.8  43.0 - 77.0 %   Lymphocytes Relative 43.1  12.0 - 46.0 %   Monocytes Relative 6.9  3.0 - 12.0 %   Eosinophils Relative 1.9  0.0 - 5.0 %   Basophils Relative 0.3  0.0 - 3.0 %   Neutro Abs 3.6  1.4 - 7.7 K/uL   Lymphs Abs 3.3  0.7 - 4.0 K/uL   Monocytes Absolute 0.5  0.1 - 1.0 K/uL   Eosinophils Absolute 0.1  0.0 - 0.7 K/uL   Basophils Absolute 0.0  0.0 - 0.1 K/uL  BASIC METABOLIC PANEL     Status: Abnormal   Collection Time    07/04/13  1:49 PM      Result Value Ref Range   Sodium 137  135 - 145 mEq/L   Potassium 3.4 (*) 3.5 - 5.1 mEq/L   Chloride 103  96 - 112 mEq/L   CO2 27  19 - 32 mEq/L   Glucose, Bld 83  70 - 99 mg/dL   BUN 13  6 - 23 mg/dL   Creatinine, Ser 0.8  0.4 - 1.2 mg/dL   Calcium 9.7  8.4 - 78.210.5 mg/dL   GFR 95.6296.27  >13.08>60.00 mL/min     RADIOLOGY RESULTS: See E-Chart or I-Site for most recent results.  Images and reports are reviewed.  [place] Pelvis Limited  07/06/2013   CLINICAL DATA:  Evaluate for femoral hernia versus lymph node.  EXAM: US PELVIS LIMITED  TECHNIQUE: Ultrasound examination of the pelvic soft tissues was performed in the area of clinical concern.  COMPARISON:  CT abdomen pelvis dated 03/18/2012.  FINDINGS: Targeted ultrasound was performed in the area of clinical concern (right inguinal region).  No hernia is visualized.  No abnormal lymph nodes are present. Small nodes measuring up to 6 mm short axis are noted.  No mass or sonographic abnormality is seen.  Significant tenderness in the right inguinal region with real-time scanning.  IMPRESSION: No sonographic abnormality is present in the right inguinal region.  Specifically, no hernia or abnormal lymph node is visualized.   Electronically Signed   By: Charline BillsSriyesh  Krishnan M.D.   On: 07/06/2013 11:02      ASSESSMENT AND PLAN: Acute right  lower quadrant pain I do not think the patient has appendicitis, but I am  concerned about the acute worsening of her pain in the last 24 hours and the fact that she has tenderness to palpation.  I do not feel a hernia or a pathologic lymph node.    Follow up to be dependent on results of scan. I suspect that this is GYN in origin.  Other possibilities are appendicitis epiploica, meckel's diverticulitis. Per the patient, she had normal labs at her PCP's office this week.       Maudry Diego MD Surgical Oncology, General and Endocrine Surgery [facility]      Visit Diagnoses: 1. Acute right lower quadrant pain     Primary Care Physician: Willow Ora, MD

## 2013-07-08 NOTE — Telephone Encounter (Signed)
Pt having CT abd/pelvis at [facility].  They will call with the report.

## 2013-07-08 NOTE — Assessment & Plan Note (Signed)
Scheduled with [facility] for evaluation.  Pelvic US ordered.  Will obtain CBC, BMP.  Alternate tylenol and Ibuprofen for pain.  Patient educated on alarm signs/symptoms and when to proceed to the ER.

## 2013-07-08 NOTE — Patient Instructions (Signed)
Get CT scan today.  Follow up will be determined on results of CT scan.

## 2013-07-08 NOTE — Telephone Encounter (Signed)
Victoria MccreedyBarbara spoke to patient this morning and give the below message from Dr. Donell BeersByerly.  Patient states she is going to try to come to appt but they are very short staffed at work.  Patient will call back if she has to reschedule.

## 2013-07-11 ENCOUNTER — Ambulatory Visit (INDEPENDENT_AMBULATORY_CARE_PROVIDER_SITE_OTHER): Payer: BC Managed Care – PPO | Admitting: General Surgery

## 2013-07-30 ENCOUNTER — Ambulatory Visit (INDEPENDENT_AMBULATORY_CARE_PROVIDER_SITE_OTHER): Payer: BC Managed Care – PPO | Admitting: Emergency Medicine

## 2013-07-30 VITALS — BP 100/70 | HR 74 | Temp 98.0°F | Resp 18 | Ht 65.0 in | Wt 168.0 lb

## 2013-07-30 DIAGNOSIS — H60339 Swimmer's ear, unspecified ear: Secondary | ICD-10-CM

## 2013-07-30 MED ORDER — NEOMYCIN-POLYMYXIN-HC 3.5-10000-1 OT SOLN: 4.0000 [drp] | Freq: Four times a day (QID) | OTIC | Status: DC

## 2013-07-30 MED ORDER — CIPROFLOXACIN-HYDROCORTISONE 0.2-1 % OT SUSP: 3.0000 [drp] | Freq: Two times a day (BID) | OTIC | Status: DC

## 2013-07-30 MED ORDER — ACETAMINOPHEN-CODEINE #3 300-30 MG PO TABS: 1.0000 | ORAL_TABLET | ORAL | Status: DC | PRN

## 2013-07-30 NOTE — Progress Notes (Signed)
[facility] [address] [place] [address] [phone]  Date:  07/30/2013   Name:  Victoria Ross   DOB:  [birth year]   MRN:  [id number]  PCP:  Willow Ora, MD    Chief Complaint: Otalgia and Lymphadenopathy   History of Present Illness:  Victoria Ross is a 23 y.o. very pleasant female patient who presents with the following:  Has pain in right ear and right cervical nodes.  No fever or chills, cough or coryza.  No sore throat.  No nausea or vomiting.  No stool change or rash. Ill for past few days. No improvement with over the counter medications or other home remedies. Denies other complaint or health concern today.   Patient Active Problem List   Diagnosis Date Noted  . Mass of right inguinal region 07/08/2013  . Acute right lower quadrant pain 07/08/2013  . Pelvic pain in female 03/18/2012  . Annual physical exam 01/19/2012  . Anxiety and depression 01/19/2012    Past Medical History  Diagnosis Date  . Seizures     last 2009  . Fracture dislocation of knee joint     rt medial plauteau  may 2013  . Anxiety and depression 01/19/2012  . Contraception     None  . Allergy   . Anxiety   . Asthma     Past Surgical History  Procedure Laterality Date  . Inguinal hernia repair  12/11/2011    Procedure: HERNIA REPAIR INGUINAL ADULT;  Surgeon: Almond Lint, MD;  Location: [facility];  Service: General;  Laterality: Right;  inguinal node biopsy  . Hernia repair      History  Substance Use Topics  . Smoking status: Current Every Day Smoker -- 0.50 packs/day for 4 years    Types: Cigarettes  . Smokeless tobacco: Never Used  . Alcohol Use: 0.6 oz/week    1 Shots of liquor per week     Comment: socially    Family History  Problem Relation Age of Onset  . Adopted: Yes    Allergies  Allergen Reactions  . Tramadol Hives and Nausea And Vomiting    Medication list has been reviewed and updated.  Current Outpatient Prescriptions on File Prior to  Visit  Medication Sig Dispense Refill  . clonazePAM (KLONOPIN) 0.5 MG tablet Take 1 tablet (0.5 mg total) by mouth 2 (two) times daily as needed for anxiety.  30 tablet  0   No current facility-administered medications on file prior to visit.    Review of Systems:  As per HPI, otherwise negative.    Physical Examination: Filed Vitals:   07/30/13 1412  BP: 100/70  Pulse: 74  Temp: 98 F (36.7 C)  Resp: 18   Filed Vitals:   07/30/13 1412  Height: 5\' 5"  (1.651 m)  Weight: 168 lb (76.204 kg)   Body mass index is 27.96 kg/(m^2). Ideal Body Weight: Weight in (lb) to have BMI = 25: 149.9  GEN: WDWN, NAD, Non-toxic, A & O x 3 HEENT: Atraumatic, Normocephalic. Neck supple. No masses, No LAD. Ears and Nose: No external deformity.  Right external otitis CV: RRR, No M/G/R. No JVD. No thrill. No extra heart sounds. PULM: CTA B, no wheezes, crackles, rhonchi. No retractions. No resp. distress. No accessory muscle use. ABD: S, NT, ND, +BS. No rebound. No HSM. EXTR: No c/c/e NEURO Normal gait.  PSYCH: Normally interactive. Conversant. Not depressed or anxious appearing.  Calm demeanor.    Assessment  and Plan: Right otitis media cipro hc  Signed,  Phillips OdorJeffery Darrien Laakso, MD

## 2013-07-30 NOTE — Patient Instructions (Signed)
Otitis Externa Otitis externa is a bacterial or fungal infection of the outer ear canal. This is the area from the eardrum to the outside of the ear. Otitis externa is sometimes called "swimmer's ear." CAUSES  Possible causes of infection include:  Swimming in dirty water.  Moisture remaining in the ear after swimming or bathing.  Mild injury (trauma) to the ear.  Objects stuck in the ear (foreign body).  Cuts or scrapes (abrasions) on the outside of the ear. SYMPTOMS  The first symptom of infection is often itching in the ear canal. Later signs and symptoms may include swelling and redness of the ear canal, ear pain, and yellowish-white fluid (pus) coming from the ear. The ear pain may be worse when pulling on the earlobe. DIAGNOSIS  Your caregiver will perform a physical exam. A sample of fluid may be taken from the ear and examined for bacteria or fungi. TREATMENT  Antibiotic ear drops are often given for 10 to 14 days. Treatment may also include pain medicine or corticosteroids to reduce itching and swelling. PREVENTION   Keep your ear dry. Use the corner of a towel to absorb water out of the ear canal after swimming or bathing.  Avoid scratching or putting objects inside your ear. This can damage the ear canal or remove the protective wax that lines the canal. This makes it easier for bacteria and fungi to grow.  Avoid swimming in lakes, polluted water, or poorly chlorinated pools.  You may use ear drops made of rubbing alcohol and vinegar after swimming. Combine equal parts of white vinegar and alcohol in a bottle. Put 3 or 4 drops into each ear after swimming. HOME CARE INSTRUCTIONS   Apply antibiotic ear drops to the ear canal as prescribed by your caregiver.  Only take over-the-counter or prescription medicines for pain, discomfort, or fever as directed by your caregiver.  If you have diabetes, follow any additional treatment instructions from your caregiver.  Keep all  follow-up appointments as directed by your caregiver. SEEK MEDICAL CARE IF:   You have a fever.  Your ear is still red, swollen, painful, or draining pus after 3 days.  Your redness, swelling, or pain gets worse.  You have a severe headache.  You have redness, swelling, pain, or tenderness in the area behind your ear. MAKE SURE YOU:   Understand these instructions.  Will watch your condition.  Will get help right away if you are not doing well or get worse. Document Released: 04/28/2005 Document Revised: 07/21/2011 Document Reviewed: 05/15/2011 ExitCare Patient Information 2014 [employer].  

## 2013-07-30 NOTE — Addendum Note (Signed)
Addended byLevon Hedger: Shakora Nordquist A on: 07/30/2013 03:20 PM   Modules accepted: Orders, Medications

## 2013-08-15 ENCOUNTER — Other Ambulatory Visit: Payer: Self-pay | Admitting: Internal Medicine

## 2013-08-24 ENCOUNTER — Telehealth: Payer: Self-pay | Admitting: Internal Medicine

## 2013-08-24 NOTE — Telephone Encounter (Signed)
Patient called and requested a refill for clonazePAM (KLONOPIN) 0.5 MG tablet [facility]  [address]

## 2013-08-25 MED ORDER — CLONAZEPAM 0.5 MG PO TABS: 0.5000 mg | ORAL_TABLET | Freq: Two times a day (BID) | ORAL | Status: DC | PRN

## 2013-08-25 NOTE — Telephone Encounter (Signed)
rx faxed to- [facility] [address].  

## 2013-08-25 NOTE — Telephone Encounter (Signed)
rx refill- clonazepam 0.5mg  Last OV- 08/20/12  Last refilled- 01/18/13 #30 / 0 r f UDS- none.

## 2013-08-25 NOTE — Addendum Note (Signed)
Addended by: Willow OraPAZ, Sierria Bruney E on: 08/25/2013 02:11 PM   Modules accepted: Orders

## 2013-08-25 NOTE — Telephone Encounter (Signed)
Okay to refill, prescription printed

## 2013-09-19 ENCOUNTER — Other Ambulatory Visit: Payer: Self-pay | Admitting: Internal Medicine

## 2013-09-20 NOTE — Telephone Encounter (Signed)
Caller name:Tenea Vance GatherShelley Relation to YQ:MVHQIONpt:patient Call back number: [phone] Pharmacy:  Reason for call: to request a refill for clonazePAM (KLONOPIN) 0.5 MG tablet and also [facility] [address] [phone]

## 2013-09-20 NOTE — Telephone Encounter (Signed)
Pt is calling again and states that [facility] will not switch over the pt's current RX's without first speaking with the pt's PCP office and then we have to start sending them to the new location. [facility] [address] phone # [phone]

## 2013-09-21 ENCOUNTER — Telehealth: Payer: Self-pay | Admitting: *Deleted

## 2013-09-21 MED ORDER — CLONAZEPAM 0.5 MG PO TABS: 0.5000 mg | ORAL_TABLET | Freq: Two times a day (BID) | ORAL | Status: DC | PRN

## 2013-09-21 NOTE — Telephone Encounter (Signed)
[facility] [address]. LMOVM

## 2013-09-21 NOTE — Telephone Encounter (Signed)
Clonazepam 0.5mg  Last OV- 08/20/12  Last refilled- 08/25/13 #30 /0 rf  UDS- none

## 2013-09-21 NOTE — Telephone Encounter (Signed)
Advise patient, prescription printed, needs office visit before next refill. Also, reminded patient she cannot take this medications if she is pregnant

## 2013-10-05 NOTE — Telephone Encounter (Signed)
Left message on voice mail for the patient to return my call regarding Klonopin Rx and which pharmacy it needs to be sent to

## 2013-10-24 ENCOUNTER — Encounter: Payer: Self-pay | Admitting: Internal Medicine

## 2013-10-24 ENCOUNTER — Ambulatory Visit (INDEPENDENT_AMBULATORY_CARE_PROVIDER_SITE_OTHER): Payer: BC Managed Care – PPO | Admitting: Internal Medicine

## 2013-10-24 VITALS — BP 103/67 | HR 59 | Temp 98.2°F | Wt 179.6 lb

## 2013-10-24 DIAGNOSIS — F341 Dysthymic disorder: Secondary | ICD-10-CM

## 2013-10-24 DIAGNOSIS — F419 Anxiety disorder, unspecified: Principal | ICD-10-CM

## 2013-10-24 DIAGNOSIS — F329 Major depressive disorder, single episode, unspecified: Secondary | ICD-10-CM

## 2013-10-24 MED ORDER — CLONAZEPAM 0.5 MG PO TABS
0.5000 mg | ORAL_TABLET | Freq: Two times a day (BID) | ORAL | Status: DC | PRN
Start: 2013-10-24 — End: 2015-05-18

## 2013-10-24 NOTE — Assessment & Plan Note (Signed)
At some point was taking Lexapro but it was d/c a whilel back, she did have some panic attacks and now is taking clonazepam as needed only with very good results. She is very aware that cannot take benzodiazepines if pregnant. Plan: counseled today Refill medications UDS May lose her insurance, if that is the case she is to return to the office in one year and will refill her medications as needed otherwise return to the office 6 months

## 2013-10-24 NOTE — Progress Notes (Signed)
   Subjective:    Patient ID: Victoria QuailMaya C Jorstad, female    DOB: 12/24/1990, 23 y.o.   MRN: [id number]  DOS:  10/24/2013 Type of  Visit: ROV History: Here for anxiety management, currently on clonazepam as needed, usually takes 3-4 doses a week. It does make her tired but at the same times helps a lot with anxiety/panic attacks.   ROS Sleeping well. Denies any depression or suicidal ideas She sees a gynecologist for her routine care  Past Medical History  Diagnosis Date  . Seizures     last 2009  . Fracture dislocation of knee joint     rt medial plauteau  may 2013  . Anxiety and depression 01/19/2012  . Contraception     None  . Allergy   . Anxiety   . Asthma     Past Surgical History  Procedure Laterality Date  . Inguinal hernia repair  12/11/2011    Procedure: HERNIA REPAIR INGUINAL ADULT;  Surgeon: Almond LintFaera Byerly, MD;  Location: [facility];  Service: General;  Laterality: Right;  inguinal node biopsy  . Hernia repair      History   Social History  . Marital Status: Single    Spouse Name: N/A    Number of Children: 0  . Years of Education: N/A   Occupational History  . works FT, [employer]    Social History Main Topics  . Smoking status: Former Smoker -- 0.50 packs/day for 4 years    Types: Cigarettes  . Smokeless tobacco: Never Used     Comment: quit 6z-2-15  . Alcohol Use: 0.6 oz/week    1 Shots of liquor per week     Comment: socially  . Drug Use: No  . Sexual Activity: Yes    Birth Control/ Protection: None   Other Topics Concern  . Not on file   Social History Narrative  . No narrative on file        Medication List       This list is accurate as of: 10/24/13 11:59 PM.  Always use your most recent med list.               clonazePAM 0.5 MG tablet  Commonly known as:  KLONOPIN  Take 1 tablet (0.5 mg total) by mouth 2 (two) times daily as needed for anxiety.           Objective:   Physical Exam BP 103/67  Pulse 59  Temp(Src) 98.2 F  (36.8 C) (Oral)  Wt 179 lb 9.6 oz (81.466 kg)  SpO2 100% General -- alert, well-developed, NAD.  Extremities-- no pretibial edema bilaterally  Neurologic--  alert & oriented X3. Speech normal, gait appropriate for age, strength symmetric and appropriate for age.   Psych-- Cognition and judgment appear intact. Cooperative with normal attention span and concentration. No anxious or depressed appearing.        Assessment & Plan:  Today , I spent more than 15   min with the patient: >50% of the time counseling regards anxiet and benzos use, s/e

## 2013-10-24 NOTE — Patient Instructions (Signed)
Please stop by the lab for a urine test (UDS)  Next visit 6 to 12 months

## 2013-11-02 ENCOUNTER — Ambulatory Visit: Payer: BC Managed Care – PPO | Admitting: Internal Medicine

## 2015-05-18 ENCOUNTER — Encounter: Payer: Self-pay | Admitting: Internal Medicine

## 2015-05-18 ENCOUNTER — Ambulatory Visit (INDEPENDENT_AMBULATORY_CARE_PROVIDER_SITE_OTHER): Payer: BLUE CROSS/BLUE SHIELD | Admitting: Internal Medicine

## 2015-05-18 VITALS — BP 116/66 | HR 70 | Temp 97.9°F | Ht 65.0 in | Wt 181.2 lb

## 2015-05-18 DIAGNOSIS — M6588 Other synovitis and tenosynovitis, other site: Secondary | ICD-10-CM | POA: Diagnosis not present

## 2015-05-18 DIAGNOSIS — H9313 Tinnitus, bilateral: Secondary | ICD-10-CM

## 2015-05-18 DIAGNOSIS — F411 Generalized anxiety disorder: Secondary | ICD-10-CM

## 2015-05-18 DIAGNOSIS — H9193 Unspecified hearing loss, bilateral: Secondary | ICD-10-CM

## 2015-05-18 DIAGNOSIS — M659 Synovitis and tenosynovitis, unspecified: Secondary | ICD-10-CM

## 2015-05-18 DIAGNOSIS — J4521 Mild intermittent asthma with (acute) exacerbation: Secondary | ICD-10-CM | POA: Diagnosis not present

## 2015-05-18 MED ORDER — AZITHROMYCIN 250 MG PO TABS: ORAL_TABLET | ORAL | Status: DC

## 2015-05-18 MED ORDER — BUDESONIDE-FORMOTEROL FUMARATE 160-4.5 MCG/ACT IN AERO: 2.0000 | INHALATION_SPRAY | Freq: Two times a day (BID) | RESPIRATORY_TRACT | Status: DC

## 2015-05-18 MED ORDER — CLONAZEPAM 0.5 MG PO TABS: 0.5000 mg | ORAL_TABLET | Freq: Two times a day (BID) | ORAL | Status: DC | PRN

## 2015-05-18 NOTE — Patient Instructions (Signed)
For cough:  Take Mucinex DM twice a day as needed until better  Symbicort 2 puffs twice a day for one month  Take the antibiotic as prescribed  (Zithromax)  Call if not gradually better over the next  few days  Use clonazepam only as needed, you cannot use it if you suspect you are pregnant.  Next visit in 6 months for a checkup.

## 2015-05-18 NOTE — Progress Notes (Signed)
Pre visit review using our clinic review tool, if applicable. No additional management support is needed unless otherwise documented below in the visit note. 

## 2015-05-18 NOTE — Progress Notes (Signed)
Subjective:    Patient ID: Victoria Ross, female    DOB: 06/20/1990, 25 y.o.   MRN: [id number]  DOS:  05/18/2015 Type of visit - description : Routine visit, several concerns Interval history:  Anxiety: Overall doing great, has not use clonazepam in a while but likes to have some in case she needs it. One-year history of bilateral tinnitus, worse on the right, associated with decreased hearing. No history of chronic exposure to noises. Also had a flulike illness in November: Fever, sinus congestion and pain, eventually developed chest congestion. Currently continue with cough, occasional green sputum production. Also, chronic bilateral hand pain, worse on the left,. Reports her hands are cold all the time.  Review of Systems Currently without sinus pain or congestion No nausea, vomiting, diarrhea When asked, admits to occasional wheezing mostly with coughing spells. She reports that when she is exposed to cold her hands get purple and when she is in a warm environment they get red but never pale   Past Medical History  Diagnosis Date  . Seizures (HCC)     last 2009  . Fracture dislocation of knee joint     rt medial plauteau  may 2013  . Anxiety and depression 01/19/2012  . Contraception     None  . Allergy   . Anxiety   . Asthma     Past Surgical History  Procedure Laterality Date  . Inguinal hernia repair  12/11/2011    Procedure: HERNIA REPAIR INGUINAL ADULT;  Surgeon: Almond Lint, MD;  Location: [facility];  Service: General;  Laterality: Right;  inguinal node biopsy  . Hernia repair      Social History   Social History  . Marital Status: Single    Spouse Name: N/A  . Number of Children: 0  . Years of Education: N/A   Occupational History  . works FT, Network engineer    Social History Main Topics  . Smoking status: Current Every Day Smoker -- 0.50 packs/day for 4 years    Types: Cigarettes  . Smokeless tobacco: Never Used     Comment:    . Alcohol Use: 0.6 oz/week      1 Shots of liquor per week     Comment: socially  . Drug Use: No  . Sexual Activity: Yes    Birth Control/ Protection: None   Other Topics Concern  . Not on file   Social History Narrative        Medication List       This list is accurate as of: 05/18/15 11:59 PM.  Always use your most recent med list.               azithromycin 250 MG tablet  Commonly known as:  ZITHROMAX Z-PAK  2 tabs a day the first day, then 1 tab a day x 4 days     budesonide-formoterol 160-4.5 MCG/ACT inhaler  Commonly known as:  SYMBICORT  Inhale 2 puffs into the lungs 2 (two) times daily.     clonazePAM 0.5 MG tablet  Commonly known as:  KLONOPIN  Take 1 tablet (0.5 mg total) by mouth 2 (two) times daily as needed for anxiety.     TYLENOL ARTHRITIS EXT RELIEF PO  Take 2 tablets by mouth every 8 (eight) hours as needed.           Objective:   Physical Exam BP 116/66 mmHg  Pulse 70  Temp(Src) 97.9 F (36.6 C) (Oral)  Ht 5'  5" (1.651 m)  Wt 181 lb 4 oz (82.214 kg)  BMI 30.16 kg/m2  SpO2 99%  LMP 05/10/2015 (Exact Date) General:   Well developed, well nourished . NAD.  HEENT:  Normocephalic . Face symmetric, atraumatic. Nose not congested, sinuses not TTP Throat symmetric, not read TMs: Slightly bulge but not read. Lungs:  + rhonchi with cough Normal respiratory effort, no intercostal retractions, no accessory muscle use. Heart: RRR,  no murmur.  No pretibial edema bilaterally  Upper extremities: Hands and wrists without evidence of synovitis at the fingers however she is TTP at the plantar aspect over the flexor tendon of the middle finger worse on the left. Normal rate pulses bilaterally. Skin: Not pale. Not jaundice Neurologic:  alert & oriented X3.  Speech normal, gait appropriate for age and unassisted Psych--  Cognition and judgment appear intact.  Cooperative with normal attention span and concentration.  Behavior appropriate. No anxious or depressed appearing.       Assessment & Plan:   Assessment Anxiety depression Seizures, last 2009 Asthma  Plan: Anxiety: Overall doing better, would like to have clonazepam just in case, prescription provided, reminded her she cannot take this medication if she even suspicious pregnant. She verbalized understanding. Asthma exacerbation: Had a flulike illness in November and since then she is coughing has occasional wheezing. Plan: Z-Pak, Symbicort for a month, Mucinex DM, call if not improving Tinnitus, decreased hearing: I'm somewhat concerned about her symptoms given the lack of noise exposure. Refer to ENT Hand pain: Tenosynovitis? See physical exam. She also has some fluctuation in hand color with a temperature, Raynaud phenomena? Refer to rheumatology Primary care: states plans to see gynecology. RTC 6 months

## 2015-08-01 ENCOUNTER — Other Ambulatory Visit: Payer: Self-pay | Admitting: Internal Medicine

## 2015-08-02 NOTE — Telephone Encounter (Signed)
Rx faxed to [facility]  

## 2015-08-02 NOTE — Telephone Encounter (Signed)
Pt is requesting refill on Clonazepam.  Last OV: 05/18/2015 Last Fill: 05/18/2015 #40 and 0RF UDS: None  Please advise.

## 2015-08-02 NOTE — Telephone Encounter (Signed)
Okay #40, no refills 

## 2015-08-02 NOTE — Telephone Encounter (Signed)
Rx printed, awaiting MD signature.  

## 2015-08-21 ENCOUNTER — Encounter: Payer: Self-pay | Admitting: Internal Medicine

## 2015-08-23 ENCOUNTER — Encounter: Payer: Self-pay | Admitting: Internal Medicine

## 2015-08-27 ENCOUNTER — Other Ambulatory Visit: Payer: Self-pay | Admitting: Internal Medicine

## 2015-08-27 MED ORDER — BUPROPION HCL ER (XL) 150 MG PO TB24: 150.0000 mg | ORAL_TABLET | Freq: Two times a day (BID) | ORAL | Status: DC

## 2015-08-29 ENCOUNTER — Encounter (HOSPITAL_BASED_OUTPATIENT_CLINIC_OR_DEPARTMENT_OTHER): Payer: Self-pay

## 2015-08-29 ENCOUNTER — Emergency Department (HOSPITAL_BASED_OUTPATIENT_CLINIC_OR_DEPARTMENT_OTHER)
Admission: EM | Admit: 2015-08-29 | Discharge: 2015-08-30 | Disposition: A | Discharge status: Home / Self Care | Payer: BLUE CROSS/BLUE SHIELD | Attending: Emergency Medicine | Admitting: Emergency Medicine

## 2015-08-29 ENCOUNTER — Telehealth: Payer: Self-pay | Admitting: Internal Medicine

## 2015-08-29 DIAGNOSIS — J45909 Unspecified asthma, uncomplicated: Secondary | ICD-10-CM | POA: Insufficient documentation

## 2015-08-29 DIAGNOSIS — R51 Headache: Secondary | ICD-10-CM

## 2015-08-29 DIAGNOSIS — G43909 Migraine, unspecified, not intractable, without status migrainosus: Secondary | ICD-10-CM | POA: Insufficient documentation

## 2015-08-29 DIAGNOSIS — F17210 Nicotine dependence, cigarettes, uncomplicated: Secondary | ICD-10-CM | POA: Insufficient documentation

## 2015-08-29 DIAGNOSIS — F329 Major depressive disorder, single episode, unspecified: Secondary | ICD-10-CM | POA: Insufficient documentation

## 2015-08-29 DIAGNOSIS — R519 Headache, unspecified: Secondary | ICD-10-CM

## 2015-08-29 DIAGNOSIS — M791 Myalgia: Secondary | ICD-10-CM | POA: Diagnosis not present

## 2015-08-29 NOTE — ED Notes (Signed)
Pt c/o " migraine" x 5 days 

## 2015-08-29 NOTE — Telephone Encounter (Signed)
Patient called @ 5:25 P.M. Stating that she needed to speak with a nurse to ask questions about headaches she had been having migraines for 5 days straight. Transferred to [facility]. Spoke with M.D.C. HoldingsKristian.

## 2015-08-30 ENCOUNTER — Emergency Department (HOSPITAL_BASED_OUTPATIENT_CLINIC_OR_DEPARTMENT_OTHER): Payer: BLUE CROSS/BLUE SHIELD

## 2015-08-30 ENCOUNTER — Telehealth: Payer: Self-pay | Admitting: Behavioral Health

## 2015-08-30 MED ORDER — PROCHLORPERAZINE EDISYLATE 5 MG/ML IJ SOLN
5.0000 mg | Freq: Once | INTRAMUSCULAR | Status: AC
  Administered 2015-08-30: 5 mg via INTRAVENOUS
  Filled 2015-08-30: qty 2

## 2015-08-30 MED ORDER — DEXAMETHASONE SODIUM PHOSPHATE 4 MG/ML IJ SOLN
10.0000 mg | Freq: Once | INTRAMUSCULAR | Status: AC
  Administered 2015-08-30: 10 mg via INTRAVENOUS
  Filled 2015-08-30: qty 3

## 2015-08-30 MED ORDER — DIPHENHYDRAMINE HCL 50 MG/ML IJ SOLN
25.0000 mg | Freq: Once | INTRAMUSCULAR | Status: AC
Start: 2015-08-30 — End: 2015-08-30
  Administered 2015-08-30: 25 mg via INTRAVENOUS
  Filled 2015-08-30: qty 1

## 2015-08-30 MED ORDER — KETOROLAC TROMETHAMINE 30 MG/ML IJ SOLN
30.0000 mg | Freq: Once | INTRAMUSCULAR | Status: AC
  Administered 2015-08-30: 30 mg via INTRAVENOUS
  Filled 2015-08-30: qty 1

## 2015-08-30 NOTE — ED Provider Notes (Signed)
CSN: [id number]     Arrival date & time 08/29/15  1846 History   First MD Initiated Contact with Patient 08/29/15 2344     Chief Complaint  Patient presents with  . Migraine     (Consider location/radiation/quality/duration/timing/severity/associated sxs/prior Treatment) HPI Comments: Patient is a 25 year old female with distant history of migraines who presents with headache. The patient has had a headache for the past 5 days. The patient describes the pain as constant. The patient has migrated from right orbital, to left temporal, to currently occipital with associated neck stiffness. Patient has associated photophobia. The patient vomited once today and patient no longer feels nauseous. Patient also had lightheadedness earlier today, but it has resolved. Patient has had a normal appetite and good fluid intake. Patient has a history of migraines, but has not had one in years. The migraines she is to get used to be frontal and she states that this is different. No known trauma. Patient denies fevers, chest pain, shortness of breath, abdominal pain, dysuria, cough, sore throat.   Patient is a 25 y.o. female presenting with migraines. The history is provided by the patient.  Migraine Associated symptoms include headaches, nausea and vomiting. Pertinent negatives include no abdominal pain, chest pain, chills, fever, rash or sore throat.    Past Medical History  Diagnosis Date  . Seizures (HCC)     last 2009  . Fracture dislocation of knee joint     rt medial plauteau  may 2013  . Anxiety and depression 01/19/2012  . Contraception     None  . Allergy   . Anxiety   . Asthma    Past Surgical History  Procedure Laterality Date  . Inguinal hernia repair  12/11/2011    Procedure: HERNIA REPAIR INGUINAL ADULT;  Surgeon: Almond Lint, MD;  Location: [facility];  Service: General;  Laterality: Right;  inguinal node biopsy  . Hernia repair     Family History  Problem Relation Age of Onset  .  Adopted: Yes   Social History  Substance Use Topics  . Smoking status: Current Every Day Smoker -- 0.50 packs/day for 4 years    Types: Cigarettes  . Smokeless tobacco: Never Used     Comment:    . Alcohol Use: 0.6 oz/week    1 Shots of liquor per week     Comment: socially   OB History    No data available     Review of Systems  Constitutional: Negative for fever and chills.  HENT: Negative for facial swelling and sore throat.   Eyes: Positive for photophobia. Negative for visual disturbance.  Respiratory: Negative for shortness of breath.   Cardiovascular: Negative for chest pain.  Gastrointestinal: Positive for nausea and vomiting. Negative for abdominal pain.  Genitourinary: Negative for dysuria.  Musculoskeletal: Negative for back pain.  Skin: Negative for rash and wound.  Neurological: Positive for light-headedness and headaches.  Psychiatric/Behavioral: The patient is not nervous/anxious.       Allergies  Tramadol  Home Medications   Prior to Admission medications   Medication Sig Start Date End Date Taking? Authorizing Provider  Acetaminophen (TYLENOL ARTHRITIS EXT RELIEF PO) Take 2 tablets by mouth every 8 (eight) hours as needed.    Historical Provider, MD  budesonide-formoterol (SYMBICORT) 160-4.5 MCG/ACT inhaler Inhale 2 puffs into the lungs 2 (two) times daily. 05/18/15   Wanda Plump, MD  buPROPion (WELLBUTRIN XL) 150 MG 24 hr tablet Take 1 tablet (150 mg total) by mouth 2 (  two) times daily. 08/27/15   Wanda PlumpJose E Paz, MD  clonazePAM (KLONOPIN) 0.5 MG tablet Take 1 tablet (0.5 mg total) by mouth 2 (two) times daily as needed for anxiety. 08/02/15   Wanda PlumpJose E Paz, MD   BP 121/86 mmHg  Pulse 77  Temp(Src) 98.7 F (37.1 C) (Oral)  Resp 16  Ht 5\' 5"  (1.651 m)  Wt 80.74 kg  BMI 29.62 kg/m2  SpO2 100%  LMP 08/08/2015 Physical Exam  Constitutional: She appears well-developed and well-nourished. No distress.  HENT:  Head: Normocephalic and atraumatic.  Mouth/Throat:  Oropharynx is clear and moist. No oropharyngeal exudate.  Eyes: Conjunctivae and EOM are normal. Pupils are equal, round, and reactive to light. Right eye exhibits no discharge. Left eye exhibits no discharge. No scleral icterus.  Neck: Normal range of motion. Neck supple. Muscular tenderness present. No spinous process tenderness present. No rigidity. Normal range of motion present. No thyromegaly present.    Pain in neck with flexion; minimal pain with neck extension  Cardiovascular: Normal rate, regular rhythm, normal heart sounds and intact distal pulses.  Exam reveals no gallop and no friction rub.   No murmur heard. Pulmonary/Chest: Effort normal and breath sounds normal. No stridor. No respiratory distress. She has no wheezes. She has no rales.  Abdominal: Soft. Bowel sounds are normal. She exhibits no distension. There is no tenderness. There is no rebound and no guarding.  Musculoskeletal: She exhibits no edema.  Lymphadenopathy:    She has no cervical adenopathy.  Neurological: She is alert. Coordination normal.  CN 3-12 intact; normal sensation throughout; 5/5 strength throughout  Skin: Skin is warm and dry. No rash noted. She is not diaphoretic. No pallor.  Psychiatric: She has a normal mood and affect.  Nursing note and vitals reviewed.   ED Course  Procedures (including critical care time) Labs Review Labs Reviewed - No data to display  Imaging Review Ct Head Wo Contrast  08/30/2015  CLINICAL DATA:  Headache for 5 days. Headache different from passed migraines. Diffuse pain. EXAM: CT HEAD WITHOUT CONTRAST TECHNIQUE: Contiguous axial images were obtained from the base of the skull through the vertex without intravenous contrast. COMPARISON:  None. FINDINGS: Brain: No evidence of acute infarction, hemorrhage, extra-axial collection, ventriculomegaly, or mass effect. Vascular: No hyperdense vessel or unexpected calcification. Skull: Negative for fracture or focal lesion.  Sinuses/Orbits: No acute findings. Other: None. IMPRESSION: No acute intracranial abnormality. Electronically Signed   By: Rubye OaksMelanie  Ehinger M.D.   On: 08/30/2015 00:57   I have personally reviewed and evaluated these images and lab results as part of my medical decision-making.   EKG Interpretation None      MDM   Pt HA treated and completely improved while in ED.  Presentation is unlike pts typical HA so CT Head was ordered and was unremarkable. Nonconcerning for SAH, ICH, Meningitis, or temporal arteritis. Pt is afebrile with no focal neuro deficits, nuchal rigidity, or change in vision. Pt is to follow up with PCP to discuss prophylactic medication. Pt verbalizes understanding and is agreeable with plan to dc. Patient was discussed with Dr. Read DriversMolpus who is in agreement with plan.   Final diagnoses:  Bad headache       Emi Holeslexandra M Chieko Neises, PA-C 08/30/15 [id number]  Paula LibraJohn Molpus, MD 08/30/15 [phone]

## 2015-08-30 NOTE — Telephone Encounter (Signed)
Pt was seen at ED last night.

## 2015-08-30 NOTE — Discharge Instructions (Signed)
Treatment: You may take ibuprofen every 4-6 hours as needed for your headache. Passive neck stretching will help your neck stiffness and tension.  Follow-up: Please follow-up with your primary care provider for further evaluation and treatment of your most likely migraine headaches. Please return to emergency department if you develop any new or worsening symptoms.   Migraine Headache A migraine headache is an intense, throbbing pain on one or both sides of your head. A migraine can last for 30 minutes to several hours. CAUSES  The exact cause of a migraine headache is not always known. However, a migraine may be caused when nerves in the brain become irritated and release chemicals that cause inflammation. This causes pain. Certain things may also trigger migraines, such as:  Alcohol.  Smoking.  Stress.  Menstruation.  Aged cheeses.  Foods or drinks that contain nitrates, glutamate, aspartame, or tyramine.  Lack of sleep.  Chocolate.  Caffeine.  Hunger.  Physical exertion.  Fatigue.  Medicines used to treat chest pain (nitroglycerine), birth control pills, estrogen, and some blood pressure medicines. SIGNS AND SYMPTOMS  Pain on one or both sides of your head.  Pulsating or throbbing pain.  Severe pain that prevents daily activities.  Pain that is aggravated by any physical activity.  Nausea, vomiting, or both.  Dizziness.  Pain with exposure to bright lights, loud noises, or activity.  General sensitivity to bright lights, loud noises, or smells. Before you get a migraine, you may get warning signs that a migraine is coming (aura). An aura may include:  Seeing flashing lights.  Seeing bright spots, halos, or zigzag lines.  Having tunnel vision or blurred vision.  Having feelings of numbness or tingling.  Having trouble talking.  Having muscle weakness. DIAGNOSIS  A migraine headache is often diagnosed based on:  Symptoms.  Physical exam.  A CT  scan or MRI of your head. These imaging tests cannot diagnose migraines, but they can help rule out other causes of headaches. TREATMENT Medicines may be given for pain and nausea. Medicines can also be given to help prevent recurrent migraines.  HOME CARE INSTRUCTIONS  Only take over-the-counter or prescription medicines for pain or discomfort as directed by your health care provider. The use of long-term narcotics is not recommended.  Lie down in a dark, quiet room when you have a migraine.  Keep a journal to find out what may trigger your migraine headaches. For example, write down:  What you eat and drink.  How much sleep you get.  Any change to your diet or medicines.  Limit alcohol consumption.  Quit smoking if you smoke.  Get 7-9 hours of sleep, or as recommended by your health care provider.  Limit stress.  Keep lights dim if bright lights bother you and make your migraines worse. SEEK IMMEDIATE MEDICAL CARE IF:   Your migraine becomes severe.  You have a fever.  You have a stiff neck.  You have vision loss.  You have muscular weakness or loss of muscle control.  You start losing your balance or have trouble walking.  You feel faint or pass out.  You have severe symptoms that are different from your first symptoms. MAKE SURE YOU:   Understand these instructions.  Will watch your condition.  Will get help right away if you are not doing well or get worse.   This information is not intended to replace advice given to you by your health care provider. Make sure you discuss any questions you have  with your health care provider.   Document Released: 04/28/2005 Document Revised: 05/19/2014 Document Reviewed: 01/03/2013 Elsevier Interactive Patient Education [employer]2016 [employer] Inc[employer]

## 2015-08-30 NOTE — Telephone Encounter (Signed)
[facility] note received via fax  Call Date: 08/29/15 Time: 6:13 PM   Caller: Deon PillingMaya Kage (Self) Return number: [phone]  Nurse: Cindee LameHilda Newby, RN  Chief Complaint: Headache  Reason for call: Caller states she is having severe headaches for about five days that are getting worse. Caller states she has had migraines before but it is different. She is sensitive to light and sometimes has blurry vision and the pain moves to different spots. No medication she has tried is helping.  Related visit to physician within the last 2 weeks: No  Guideline: Headache  Disposition: Go to ED now  Patient was seen at the ED on 08/29/15.

## 2015-09-17 ENCOUNTER — Encounter: Payer: Self-pay | Admitting: Internal Medicine

## 2015-09-18 NOTE — Telephone Encounter (Signed)
LM for pt to call in and schedule OV with Dr. Drue NovelPaz

## 2015-09-24 ENCOUNTER — Ambulatory Visit (INDEPENDENT_AMBULATORY_CARE_PROVIDER_SITE_OTHER): Payer: BLUE CROSS/BLUE SHIELD | Admitting: Internal Medicine

## 2015-09-24 ENCOUNTER — Encounter: Payer: Self-pay | Admitting: Internal Medicine

## 2015-09-24 VITALS — BP 112/64 | HR 81 | Temp 98.2°F | Ht 65.0 in | Wt 183.5 lb

## 2015-09-24 DIAGNOSIS — R51 Headache: Secondary | ICD-10-CM

## 2015-09-24 DIAGNOSIS — J4520 Mild intermittent asthma, uncomplicated: Secondary | ICD-10-CM

## 2015-09-24 DIAGNOSIS — R519 Headache, unspecified: Secondary | ICD-10-CM

## 2015-09-24 DIAGNOSIS — Z09 Encounter for follow-up examination after completed treatment for conditions other than malignant neoplasm: Secondary | ICD-10-CM

## 2015-09-24 MED ORDER — KETOROLAC TROMETHAMINE 10 MG PO TABS: 10.0000 mg | ORAL_TABLET | Freq: Four times a day (QID) | ORAL | Status: DC | PRN

## 2015-09-24 MED ORDER — ONDANSETRON HCL 8 MG PO TABS: 8.0000 mg | ORAL_TABLET | Freq: Three times a day (TID) | ORAL | Status: AC | PRN

## 2015-09-24 MED ORDER — TOPIRAMATE 25 MG PO TABS: 25.0000 mg | ORAL_TABLET | Freq: Two times a day (BID) | ORAL | Status: DC

## 2015-09-24 NOTE — Progress Notes (Signed)
Pre visit review using our clinic review tool, if applicable. No additional management support is needed unless otherwise documented below in the visit note. 

## 2015-09-24 NOTE — Progress Notes (Signed)
Subjective:    Patient ID: Victoria Ross, female    DOB: 08/06/1990, 25 y.o.   MRN: [id number]  DOS:  09/24/2015 Type of visit - description :  Here for headaches management Interval history:  Was seen at the ER 08/29/2015 w/ 5 days history of a headache: On the right side, then moved to  the left and eventually to the top of the head. She also has some neck pain and later on neck stiffness. She was dizzy, had some nausea, + light sensitivity. + visual disturbances described as floaters on the right and a strong light on the left eye. She responded well to treatment, CT head negative.  On 09/15/2015 had another episodes of mild headache, no light sensitivity, mild nausea and dizziness.  On 09/22/2015 had another episode, this time the  headache was intense, she had nausea and vomited twice, HA felt pulsatile, located on the top of the head like "somebody put a heavy book on top of my head". She again had some neck discomfort that persist to some extent to today.  On further questioning, when she was in high school she was diagnosed with headaches, she does not remember the features but she had some nausea w/ them;  saw neurology.  Today she is symptom-free   Not on BCP, LMP 09-11-15, OTC pregnancy test neg 1 week ago  Review of Systems  No fever chills, no sinus symptoms Denies any recent head injury Is not sleep deprived No change in her stress level, not particularly anxious or depressed Denies diplopia, slurred speech, motor deficits.   Past Medical History  Diagnosis Date  . Seizures (HCC)     last 2009  . Fracture dislocation of knee joint     rt medial plauteau  may 2013  . Anxiety and depression 01/19/2012  . Contraception     None  . Allergy   . Asthma     Past Surgical History  Procedure Laterality Date  . Inguinal hernia repair  12/11/2011    Procedure: HERNIA REPAIR INGUINAL ADULT;  Surgeon: Almond LintFaera Byerly, MD;  Location: [facility];  Service: General;  Laterality:  Right;  inguinal node biopsy  . Hernia repair      Social History   Social History  . Marital Status: Single    Spouse Name: N/A  . Number of Children: 0  . Years of Education: N/A   Occupational History  . works FT, [employer]    Social History Main Topics  . Smoking status: Current Every Day Smoker -- 0.50 packs/day for 4 years    Types: Cigarettes  . Smokeless tobacco: Never Used     Comment:    . Alcohol Use: 0.6 oz/week    1 Shots of liquor per week     Comment: socially  . Drug Use: No  . Sexual Activity: Yes    Birth Control/ Protection: None   Other Topics Concern  . Not on file   Social History Narrative        Medication List       This list is accurate as of: 09/24/15 11:59 PM.  Always use your most recent med list.               budesonide-formoterol 160-4.5 MCG/ACT inhaler  Commonly known as:  SYMBICORT  Inhale 2 puffs into the lungs 2 (two) times daily.     clonazePAM 0.5 MG tablet  Commonly known as:  KLONOPIN  Take 1 tablet (0.5  mg total) by mouth 2 (two) times daily as needed for anxiety.     ketorolac 10 MG tablet  Commonly known as:  TORADOL  Take 1 tablet (10 mg total) by mouth every 6 (six) hours as needed.     ondansetron 8 MG tablet  Commonly known as:  ZOFRAN  Take 1 tablet (8 mg total) by mouth every 8 (eight) hours as needed for nausea or vomiting.     topiramate 25 MG tablet  Commonly known as:  TOPAMAX  Take 1 tablet (25 mg total) by mouth 2 (two) times daily.     TYLENOL ARTHRITIS EXT RELIEF PO  Take 2 tablets by mouth every 8 (eight) hours as needed.           Objective:   Physical Exam BP 112/64 mmHg  Pulse 81  Temp(Src) 98.2 F (36.8 C) (Oral)  Ht  (1.651 m)  Wt 183 lb 8 oz (83.235 kg)  BMI 30.54 kg/m2  SpO2 97%  LMP 08/08/2015 General:   Well developed, well nourished . NAD.  HEENT:  Normocephalic . Face symmetric, atraumatic. Neck: Full range of motion, slightly TTP mostly at the upper  trapezoid areas bilaterally. Lungs:  CTA B Normal respiratory effort, no intercostal retractions, no accessory muscle use. Heart: RRR,  no murmur.  No pretibial edema bilaterally  Skin: Not pale. Not jaundice Neurologic:  alert & oriented X3.  Speech normal, gait appropriate for age and unassisted. EOMI, pupils equal and reactive, DTRs symmetric, face and motor symmetric. Psych--  Cognition and judgment appear intact.  Cooperative with normal attention span and concentration.  Behavior appropriate. No anxious or depressed appearing.      Assessment & Plan:   Assessment Anxiety depression Migraines: The exiting high school, asxc until 08-2015 Seizures, last 2009 Asthma  Plan: Headaches: As described above, likely migraines as she was dx w/  such when she was in high school. The only feature that somewhat concerns me is the neck pain when she has the headaches and the mild lingering neck discomfort/stiffness. Neuro exam is nonfocal. CT of the head was negative. Plan:  Refer to neurology, I wonder if she needs further evaluation with MRI/MRA or can be treated base on the clinical diagnosis Start Topamax, is pregnancy category D, pt aware Abortive Rx w/ rest, fluids, toradol, Zofran Go to the ER if she has any unusual headaches Asthma: Under excellent control with Symbicort Tobacco abuse: Has significantly decrease her consumption, never started Wellbutrin, because the headaches. We'll reassess the situation in few months. RTC 2 months

## 2015-09-24 NOTE — Patient Instructions (Signed)
Start taking Topamax 1 tablet at bedtime  Don't increase to one tablet twice a day until you talk with neurology    If you develop a headache: Rest, drink plenty of fluids Take Toradol every 6 hours as needed for pain Take Zofran every 8 hours as needed for nausea  If you are not responding well to the medication, you have severe symptoms, neck stiffness : Go to the ER  Watch for triggers   Next visit here 2 months.

## 2015-09-25 DIAGNOSIS — Z09 Encounter for follow-up examination after completed treatment for conditions other than malignant neoplasm: Secondary | ICD-10-CM | POA: Insufficient documentation

## 2015-09-25 DIAGNOSIS — J45909 Unspecified asthma, uncomplicated: Secondary | ICD-10-CM | POA: Insufficient documentation

## 2015-09-25 NOTE — Assessment & Plan Note (Signed)
Headaches: As described above, likely migraines as she was dx w/  such when she was in high school. The only feature that somewhat concerns me is the neck pain when she has the headaches and the mild lingering neck discomfort/stiffness. Neuro exam is nonfocal. CT of the head was negative. Plan:  Refer to neurology, I wonder if she needs further evaluation with MRI/MRA or can be treated base on the clinical diagnosis Start Topamax, is pregnancy category D, pt aware Abortive Rx w/ rest, fluids, toradol, Zofran Go to the ER if she has any unusual headaches Asthma: Under excellent control with Symbicort Tobacco abuse: Has significantly decrease her consumption, never started Wellbutrin, because the headaches. We'll reassess the situation in few months. RTC 2 months

## 2015-10-01 ENCOUNTER — Encounter: Payer: Self-pay | Admitting: Internal Medicine

## 2015-10-12 ENCOUNTER — Ambulatory Visit: Payer: BLUE CROSS/BLUE SHIELD | Admitting: Internal Medicine

## 2015-10-15 ENCOUNTER — Encounter: Payer: Self-pay | Admitting: Internal Medicine

## 2015-10-16 ENCOUNTER — Telehealth: Payer: Self-pay | Admitting: Internal Medicine

## 2015-10-16 NOTE — Telephone Encounter (Signed)
See patient's message below, please call her and advised the following 1. Okay to increase Topamax 25 mg for HA prevention: Take 2 tablets daily for one week, then 3 tablets daily. Send a new prescription. 2. I'm somewhat concerned about the persistent HA x 3 days, if she is not better by now needs to be seen tomorrow, ER if severe symptoms. JP   --------------- I had a migraine right after starting the new medication you gave me, didn't think anything of it because I knew it takes time to get into my system and work. So, I took a pain pill and it went away. Few weeks went by and no more migraines!! Was so excited and then...this past Friday 10/12/15 - today, terrible migraine. Nausea meds are miracle workers and I haven't gotten sick so far but the pain meds only dull it for a few hours and then the throbbing comes right back. I've had this for 3 full days now. Do you think I should increase the migraine prevention medication? I'm only taking one pill at bedtime 25mg . I don't know if its safe to keep taking pain medication. Thanks.   Big LotsMaya

## 2015-10-17 MED ORDER — TOPIRAMATE 25 MG PO TABS
ORAL_TABLET | ORAL | Status: DC
Start: 2015-10-17 — End: 2015-11-26

## 2015-10-17 NOTE — Telephone Encounter (Signed)
Called pt and LMOM to check MyChart for instructions and to call office to schedule appointment if still experiencing HA. Will follow-up to ensure that pt reads MyChart message. Medication filled to pharmacy as requested.

## 2015-10-17 NOTE — Telephone Encounter (Signed)
Called pt and LMOM to return call.

## 2015-10-17 NOTE — Telephone Encounter (Signed)
Last Read in MyChart  10/17/2015 3:08 PM by Leda QuailMaya C Kalbfleisch

## 2015-11-16 ENCOUNTER — Ambulatory Visit: Payer: BLUE CROSS/BLUE SHIELD | Admitting: Internal Medicine

## 2015-11-23 ENCOUNTER — Ambulatory Visit: Payer: BLUE CROSS/BLUE SHIELD | Admitting: Neurology

## 2015-11-23 DIAGNOSIS — Z029 Encounter for administrative examinations, unspecified: Secondary | ICD-10-CM

## 2015-11-26 ENCOUNTER — Other Ambulatory Visit: Payer: Self-pay | Admitting: Internal Medicine

## 2015-11-26 MED ORDER — TOPIRAMATE 100 MG PO TABS: 100.0000 mg | ORAL_TABLET | Freq: Every day | ORAL | Status: DC

## 2015-11-26 NOTE — Telephone Encounter (Signed)
Advise patient: Will stay on Topamax, I sent a prescription for 100 mg one tablet daily(was on 25 mg 3 tabs a day). Also, she was supposed to see neurology, please get the report.

## 2015-11-26 NOTE — Telephone Encounter (Signed)
Pt is requesting refill on Topamax. Please advise.

## 2016-02-12 ENCOUNTER — Encounter: Payer: Self-pay | Admitting: Internal Medicine

## 2016-02-13 ENCOUNTER — Other Ambulatory Visit: Payer: Self-pay | Admitting: Internal Medicine

## 2016-02-13 MED ORDER — BECLOMETHASONE DIPROPIONATE 80 MCG/ACT IN AERS: 1.0000 | INHALATION_SPRAY | Freq: Two times a day (BID) | RESPIRATORY_TRACT | 12 refills | Status: DC

## 2016-03-31 ENCOUNTER — Other Ambulatory Visit: Payer: Self-pay | Admitting: Internal Medicine

## 2016-05-02 ENCOUNTER — Other Ambulatory Visit: Payer: Self-pay | Admitting: Internal Medicine

## 2016-05-06 ENCOUNTER — Telehealth: Payer: Self-pay | Admitting: Internal Medicine

## 2016-05-06 NOTE — Telephone Encounter (Signed)
Pt called in because she says that she only have 15 pills left of her ketorolac medication. She says that she would like to go ahead and have her CPE instead of a medication fu. Informed pt of PCP's next CPE. She says that she would like to have it sooner if possible. Informed pt that I would have to be advised by PCP for scheduling.   Please advise for scheduling.

## 2016-05-06 NOTE — Telephone Encounter (Signed)
Okay to use two-15 minute appts or hosp f/u slot.

## 2016-05-09 NOTE — Telephone Encounter (Signed)
Pt has been scheduled. Used 2 slots to schedule pt.

## 2016-05-13 ENCOUNTER — Encounter: Payer: Self-pay | Admitting: Internal Medicine

## 2016-05-13 ENCOUNTER — Ambulatory Visit (INDEPENDENT_AMBULATORY_CARE_PROVIDER_SITE_OTHER): Payer: BLUE CROSS/BLUE SHIELD | Admitting: Internal Medicine

## 2016-05-13 ENCOUNTER — Ambulatory Visit (HOSPITAL_BASED_OUTPATIENT_CLINIC_OR_DEPARTMENT_OTHER)
Admission: RE | Admit: 2016-05-13 | Discharge: 2016-05-13 | Disposition: A | Discharge status: Home / Self Care | Payer: BLUE CROSS/BLUE SHIELD | Source: Ambulatory Visit | Attending: Internal Medicine | Admitting: Internal Medicine

## 2016-05-13 VITALS — BP 122/74 | HR 66 | Temp 98.2°F | Resp 14 | Ht 65.0 in | Wt 198.2 lb

## 2016-05-13 DIAGNOSIS — M25572 Pain in left ankle and joints of left foot: Secondary | ICD-10-CM

## 2016-05-13 DIAGNOSIS — Z23 Encounter for immunization: Secondary | ICD-10-CM

## 2016-05-13 DIAGNOSIS — Z0000 Encounter for general adult medical examination without abnormal findings: Secondary | ICD-10-CM

## 2016-05-13 LAB — CBC WITH DIFFERENTIAL/PLATELET
BASOS ABS: 0 10*3/uL (ref 0.0–0.1)
Basophils Relative: 0.2 % (ref 0.0–3.0)
Eosinophils Absolute: 0.1 10*3/uL (ref 0.0–0.7)
Eosinophils Relative: 1 % (ref 0.0–5.0)
HCT: 39 % (ref 36.0–46.0)
Hemoglobin: 13.1 g/dL (ref 12.0–15.0)
LYMPHS ABS: 2.7 10*3/uL (ref 0.7–4.0)
Lymphocytes Relative: 34.2 % (ref 12.0–46.0)
MCHC: 33.7 g/dL (ref 30.0–36.0)
MCV: 87.5 fl (ref 78.0–100.0)
Monocytes Absolute: 0.5 10*3/uL (ref 0.1–1.0)
Monocytes Relative: 6.3 % (ref 3.0–12.0)
NEUTROS PCT: 58.3 % (ref 43.0–77.0)
Neutro Abs: 4.5 10*3/uL (ref 1.4–7.7)
Platelets: 270 10*3/uL (ref 150.0–400.0)
RBC: 4.47 Mil/uL (ref 3.87–5.11)
RDW: 12.2 % (ref 11.5–15.5)
WBC: 7.8 10*3/uL (ref 4.0–10.5)

## 2016-05-13 LAB — COMPREHENSIVE METABOLIC PANEL
ALT: 20 U/L (ref 0–35)
AST: 18 U/L (ref 0–37)
Albumin: 4.4 g/dL (ref 3.5–5.2)
Alkaline Phosphatase: 58 U/L (ref 39–117)
BUN: 16 mg/dL (ref 6–23)
CO2: 25 meq/L (ref 19–32)
Calcium: 9.5 mg/dL (ref 8.4–10.5)
Chloride: 106 mEq/L (ref 96–112)
Creatinine, Ser: 0.85 mg/dL (ref 0.40–1.20)
GFR: 85.23 mL/min (ref >60.00)
GLUCOSE: 103 mg/dL — AB (ref 70–99)
Potassium: 3.8 mEq/L (ref 3.5–5.1)
Sodium: 137 mEq/L (ref 135–145)
Total Bilirubin: 0.7 mg/dL (ref 0.2–1.2)
Total Protein: 7 g/dL (ref 6.0–8.3)

## 2016-05-13 LAB — LIPID PANEL
Cholesterol: 176 mg/dL (ref 0–200)
HDL: 52.5 mg/dL (ref >39.00)
LDL CALC: 102 mg/dL — AB (ref 0–99)
NONHDL: 123.29
TRIGLYCERIDES: 107 mg/dL (ref 0.0–149.0)
Total CHOL/HDL Ratio: 3
VLDL: 21.4 mg/dL (ref 0.0–40.0)

## 2016-05-13 LAB — TSH: TSH: 3.1 u[IU]/mL (ref 0.35–4.50)

## 2016-05-13 MED ORDER — KETOROLAC TROMETHAMINE 10 MG PO TABS: 10.0000 mg | ORAL_TABLET | Freq: Four times a day (QID) | ORAL | 0 refills | Status: AC | PRN

## 2016-05-13 MED ORDER — TOPIRAMATE 100 MG PO TABS: 100.0000 mg | ORAL_TABLET | Freq: Every day | ORAL | 3 refills | Status: DC

## 2016-05-13 NOTE — Patient Instructions (Signed)
GO TO THE LAB : Get the blood work     GO TO THE FRONT DESK Schedule your next appointment for a  follow-up in 6 months   STOP BY THE FIRST FLOOR:  get the XR   ICE, rest were leg elevated, Motrin as needed for pain. Call if not gradually improving  For asthma: Please call your insurance and see if any of the following medications are covered (to replace Qvair) : Symbicort, Dulera, Advair

## 2016-05-13 NOTE — Progress Notes (Signed)
Pre visit review using our clinic review tool, if applicable. No additional management support is needed unless otherwise documented below in the visit note. 

## 2016-05-13 NOTE — Progress Notes (Signed)
Subjective:    Patient ID: Victoria Ross, female    DOB: 1990-12-01, 26 y.o.   MRN: [id number]  DOS:  05/13/2016 Type of visit - description : cpx Interval history: Here for a CPX, we also discussed other issues  Review of Systems Constitutional: No fever. No chills. No unexplained wt changes. No unusual sweats  HEENT: No dental problems, no ear discharge, no facial swelling, no voice changes. No eye discharge, no eye  redness , no  intolerance to light   Respiratory:  Asthma not as well-controlled as before worse since she was switch to Qvar: Has SOB, requires albuterol 2 to 3 times a day with good results. No actual coughing or wheezing.  Cardiovascular: No CP, no leg swelling , no  Palpitations  GI: no nausea, no vomiting, no diarrhea , no  abdominal pain.  No blood in the stools. No dysphagia, no odynophagia    Endocrine: No polyphagia, no polyuria , no polydipsia  GU: No dysuria, gross hematuria, difficulty urinating. No urinary urgency, no frequency.  Musculoskeletal:  2 days ago patient was playing with her boyfriend, he landed on her left leg, complaining of pain at the ankle and mostly at the dorsum of the left foot.  Skin: No change in the color of the skin, palor , no  Rash  Allergic, immunologic: No environmental allergies , no  food allergies  Neurological: Taking Topamax for migraine headaches, symptoms are significantly improved  Hematological: No enlarged lymph nodes, no easy bruising , no unusual bleedings  Psychiatry: No suicidal ideas, no hallucinations, no beavior problems, no confusion.  No unusual/severe anxiety, no depression    Past Medical History:  Diagnosis Date  . Allergy   . Anxiety and depression 01/19/2012  . Asthma   . Contraception    None  . Fracture dislocation of knee joint    rt medial plauteau  may 2013  . Seizures (HCC)    last 2009    Past Surgical History:  Procedure Laterality Date  . INGUINAL HERNIA REPAIR  12/11/2011   Procedure: --R-- HERNIA REPAIR INGUINAL ADULT;  Surgeon: Almond Lint, MD;  Location: [facility];  Service: General;  Laterality: Right;  inguinal node biopsy    Social History   Social History  . Marital status: Single    Spouse name: N/A  . Number of children: 0  . Years of education: N/A   Occupational History  . works Teacher, [employer]   Social History Main Topics  . Smoking status: Former Smoker    Packs/day: 0.50    Years: 4.00    Types: Cigarettes  . Smokeless tobacco: Never Used     Comment: vapes   . Alcohol use 0.6 oz/week    1 Shots of liquor per week     Comment: socially  . Drug use: No  . Sexual activity: Yes    Birth control/ protection: None   Other Topics Concern  . Not on file   Social History Narrative   Lives w/ boyfriend (and his GM Mrs Art Buff)      Family History  Problem Relation Age of Onset  . Adopted: Yes  . AAA (abdominal aortic aneurysm) Neg Hx      Allergies as of 05/13/2016      Reactions   Tramadol Hives, Nausea And Vomiting      Medication List       Accurate as of 05/13/16  4:40 PM. Always use your most recent  med list.          beclomethasone 80 MCG/ACT inhaler Commonly known as:  QVAR Inhale 1 puff into the lungs 2 (two) times daily.   clonazePAM 0.5 MG tablet Commonly known as:  KLONOPIN Take 1 tablet (0.5 mg total) by mouth 2 (two) times daily as needed for anxiety.   ketorolac 10 MG tablet Commonly known as:  TORADOL Take 1 tablet (10 mg total) by mouth every 6 (six) hours as needed.   ondansetron 8 MG tablet Commonly known as:  ZOFRAN Take 1 tablet (8 mg total) by mouth every 8 (eight) hours as needed for nausea or vomiting.   topiramate 100 MG tablet Commonly known as:  TOPAMAX Take 1 tablet (100 mg total) by mouth daily.   TYLENOL ARTHRITIS EXT RELIEF PO Take 2 tablets by mouth every 8 (eight) hours as needed.          Objective:   Physical Exam BP 122/74 (BP Location: Left Arm,  Patient Position: Sitting, Cuff Size: Normal)   Pulse 66   Temp 98.2 F (36.8 C) (Oral)   Resp 14   Ht 5\' 5"  (1.651 m)   Wt 198 lb 4 oz (89.9 kg)   LMP 04/28/2016 (Approximate)   SpO2 98%   BMI 32.99 kg/m   General:   Well developed, well nourished . NAD.  Neck: No  thyromegaly  HEENT:  Normocephalic . Face symmetric, atraumatic Lungs:  CTA B Normal respiratory effort, no intercostal retractions, no accessory muscle use. Heart: RRR,  no murmur.  No pretibial edema bilaterally  Abdomen:  Not distended, soft, non-tender. No rebound or rigidity.  MSK: --Right leg normal --Left leg: Calf --Soft, symmetric, no TTP. Ankle: No deformities, moderately TTP at both malleoli areas. Foot: No deformities, definitely tender at the dorsum of the foot. Good capillary refill and pedal pulses. She does have ecchymosis on the inner aspect of the L pretibial area and some in the ankle.  Skin: Exposed areas without rash. Not pale. Not jaundice Neurologic:  alert & oriented X3.  Speech normal, gait appropriate for age and unassisted Strength symmetric and appropriate for age.  Psych: Cognition and judgment appear intact.  Cooperative with normal attention span and concentration.  Behavior appropriate. No anxious or depressed appearing.    Assessment & Plan:   Assessment Anxiety depression Migraines: dx in high school, asxc until 08-2015 . CT head (-) @ ER 08-2015 Seizures, last 2009 Asthma  Plan: Anxiety depression: Well-controlled clonazepam, RF as needed Migraines: Since  Last OV, did not pursue neurology referral, good compliance w/Topamax and uses Toradol as needed only. Will RF PRN. Call if frequency of HAs increases. She'll reminded that she cannot take Topamax if pregnant. Left leg injury: Ice, rest, leg elevation and x-ray. Call if not better Asthma: Was better controlled w/ Symbicort, recommend to call her insurance and see about alternative. See instructions RTC 6 months

## 2016-05-13 NOTE — Assessment & Plan Note (Signed)
Tdap 2013; flu shot today. Pneumonia shot today. Sees gyn q 2 -3 years  Labs: CMP, FLP, CBC, TSH Diet and exercise discussed

## 2016-05-13 NOTE — Assessment & Plan Note (Signed)
Anxiety depression: Well-controlled clonazepam, RF as needed Migraines: Since  Last OV, did not pursue neurology referral, good compliance w/Topamax and uses Toradol as needed only. Will RF PRN. Call if frequency of HAs increases. She'll reminded that she cannot take Topamax if pregnant. Left leg injury: Ice, rest, leg elevation and x-ray. Call if not better Asthma: Was better controlled w/ Symbicort, recommend to call her insurance and see about alternative. See instructions RTC 6 months

## 2016-05-23 ENCOUNTER — Ambulatory Visit (INDEPENDENT_AMBULATORY_CARE_PROVIDER_SITE_OTHER): Payer: BLUE CROSS/BLUE SHIELD | Admitting: Internal Medicine

## 2016-05-23 ENCOUNTER — Encounter: Payer: Self-pay | Admitting: Internal Medicine

## 2016-05-23 VITALS — BP 118/78 | HR 97 | Temp 98.1°F | Resp 14 | Ht 65.0 in | Wt 199.5 lb

## 2016-05-23 DIAGNOSIS — J4531 Mild persistent asthma with (acute) exacerbation: Secondary | ICD-10-CM | POA: Diagnosis not present

## 2016-05-23 DIAGNOSIS — B9789 Other viral agents as the cause of diseases classified elsewhere: Secondary | ICD-10-CM

## 2016-05-23 DIAGNOSIS — J069 Acute upper respiratory infection, unspecified: Secondary | ICD-10-CM | POA: Diagnosis not present

## 2016-05-23 MED ORDER — AZITHROMYCIN 250 MG PO TABS: ORAL_TABLET | ORAL | 0 refills | Status: AC

## 2016-05-23 MED ORDER — TOBRAMYCIN 0.3 % OP SOLN: 1.0000 [drp] | Freq: Four times a day (QID) | OPHTHALMIC | 0 refills | Status: AC

## 2016-05-23 MED ORDER — MOMETASONE FURO-FORMOTEROL FUM 100-5 MCG/ACT IN AERO: 2.0000 | INHALATION_SPRAY | Freq: Two times a day (BID) | RESPIRATORY_TRACT | 6 refills | Status: AC

## 2016-05-23 MED ORDER — BUDESONIDE-FORMOTEROL FUMARATE 80-4.5 MCG/ACT IN AERO: 2.0000 | INHALATION_SPRAY | Freq: Two times a day (BID) | RESPIRATORY_TRACT | 6 refills | Status: DC

## 2016-05-23 NOTE — Progress Notes (Signed)
Pre visit review using our clinic review tool, if applicable. No additional management support is needed unless otherwise documented below in the visit note. 

## 2016-05-23 NOTE — Progress Notes (Signed)
Subjective:    Patient ID: Victoria Ross, female    DOB: 11/08/1990, 26 y.o.   MRN: [id number]  DOS:  05/23/2016 Type of visit - description : acute Interval history: Sx started a week ago : Stuffy nose, postnasal dripping, cough, increased wheezing. Also the left eye got swollen and red. Use OTC eyedrops and is a slightly better but now the right eye is also crusting the mornings. She got a flu and pneumonia shot 2 days before the symptoms onset.   Review of Systems + Subjective fever and chills last week. Recently No nausea, vomiting, aches No visual disturbances + Right ear pain but no discharge  Past Medical History:  Diagnosis Date  . Allergy   . Anxiety and depression 01/19/2012  . Asthma   . Contraception    None  . Fracture dislocation of knee joint    rt medial plauteau  may 2013  . Seizures (HCC)    last 2009    Past Surgical History:  Procedure Laterality Date  . INGUINAL HERNIA REPAIR  12/11/2011   Procedure: --R-- HERNIA REPAIR INGUINAL ADULT;  Surgeon: Almond LintFaera Byerly, MD;  Location: [facility];  Service: General;  Laterality: Right;  inguinal node biopsy    Social History   Social History  . Marital status: Single    Spouse name: N/A  . Number of children: 0  . Years of education: N/A   Occupational History  . works Teacher, [employer]   Social History Main Topics  . Smoking status: Former Smoker    Packs/day: 0.50    Years: 4.00    Types: Cigarettes  . Smokeless tobacco: Never Used     Comment: vapes   . Alcohol use 0.6 oz/week    1 Shots of liquor per week     Comment: socially  . Drug use: No  . Sexual activity: Yes    Birth control/ protection: None   Other Topics Concern  . Not on file   Social History Narrative   Lives w/ boyfriend (and his GM Mrs Art BuffRinaldi)       Allergies as of 05/23/2016      Reactions   Tramadol Hives, Nausea And Vomiting      Medication List       Accurate as of 05/23/16  9:48 AM. Always use  your most recent med list.          beclomethasone 80 MCG/ACT inhaler Commonly known as:  QVAR Inhale 1 puff into the lungs 2 (two) times daily.   clonazePAM 0.5 MG tablet Commonly known as:  KLONOPIN Take 1 tablet (0.5 mg total) by mouth 2 (two) times daily as needed for anxiety.   ketorolac 10 MG tablet Commonly known as:  TORADOL Take 1 tablet (10 mg total) by mouth every 6 (six) hours as needed.   ondansetron 8 MG tablet Commonly known as:  ZOFRAN Take 1 tablet (8 mg total) by mouth every 8 (eight) hours as needed for nausea or vomiting.   topiramate 100 MG tablet Commonly known as:  TOPAMAX Take 1 tablet (100 mg total) by mouth daily.   TYLENOL ARTHRITIS EXT RELIEF PO Take 2 tablets by mouth every 8 (eight) hours as needed.          Objective:   Physical Exam BP 118/78 (BP Location: Left Arm, Patient Position: Sitting, Cuff Size: Normal)   Pulse 97   Temp 98.1 F (36.7 C) (Oral)   Resp 14  Ht 5\' 5"  (1.651 m)   Wt 199 lb 8 oz (90.5 kg)   LMP 04/28/2016 (Approximate)   SpO2 99%   BMI 33.20 kg/m  General:   Well developed, well nourished . NAD.  HEENT:  Normocephalic . Face symmetric, atraumatic. Throat symmetric, no red. Nose very congested, sinuses no TTP Left ear normal Right year: Mild discomfort when I introduced the otoscope but the canal is not swollen, TM normal EOMI. Pupils equal and reactive. Left conjunctiva slightly red and watery. Anterior chambers normal. Lungs:  Frequent cough noted, prolonged expiratory time but no wheezing or crackles. Normal respiratory effort, no intercostal retractions, no accessory muscle use. Heart: RRR,  no murmur.  No pretibial edema bilaterally  Skin: Not pale. Not jaundice Neurologic:  alert & oriented X3.  Speech normal, gait appropriate for age and unassisted Psych--  Cognition and judgment appear intact.  Cooperative with normal attention span and concentration.  Behavior appropriate. No anxious or  depressed appearing.      Assessment & Plan:   Assessment Anxiety depression Migraines: dx in high school, asxc until 08-2015 . CT head (-) @ ER 08-2015 Seizures, last 2009 Asthma  Plan: URI: Sx c/w URI, likely viral associated with mild asthma exacerbation and conjunctivitis Plan: Zithromax, Tobrex, symptomatic treatment. See instructions Asthma: Mildly exacerbated (increased cough, increased expiratory time, subjective wheezing) recommend to stop Qvar, switch to either Symbicort or Dulera and continue albuterol. Symptoms started 3 days after she got pneumonia and flu shot but I doubt they are related. She did have some pain and mild redness and the shoulder after the injections.

## 2016-05-23 NOTE — Patient Instructions (Addendum)
Rest, fluids , tylenol  For cough:  Take Mucinex DM twice a day as needed until better  For nasal congestion: Use OTC Nasocort or Flonase : 2 nasal sprays on each side of the nose in the morning until you feel better   Get pseudoephedrine 30 mg (behind the counter, you need to talk with the pharmacist) take one tablet 3 or 4 times a day as needed for congestion  Take the antibiotic as prescribed  (zithromax)  Use the eyedrops for 5 days along with a cold compress  For asthma: Stop Qvar Start either Dulera OR Symbicort Continue albuterol as needed  Call if not gradually better over the next  10 days  Call anytime if the symptoms are severe

## 2016-05-25 NOTE — Assessment & Plan Note (Signed)
URI: Sx c/w URI, likely viral associated with mild asthma exacerbation and conjunctivitis Plan: Zithromax, Tobrex, symptomatic treatment. See instructions Asthma: Mildly exacerbated (increased cough, increased expiratory time, subjective wheezing) recommend to stop Qvar, switch to either Symbicort or Dulera and continue albuterol. Symptoms started 3 days after she got pneumonia and flu shot but I doubt they are related. She did have some pain and mild redness and the shoulder after the injections.

## 2016-09-01 ENCOUNTER — Telehealth: Payer: Self-pay | Admitting: Internal Medicine

## 2016-09-01 MED ORDER — BUDESONIDE-FORMOTEROL FUMARATE 80-4.5 MCG/ACT IN AERO: 2.0000 | INHALATION_SPRAY | Freq: Two times a day (BID) | RESPIRATORY_TRACT | 5 refills | Status: AC

## 2016-09-01 MED ORDER — TOPIRAMATE 100 MG PO TABS: 100.0000 mg | ORAL_TABLET | Freq: Every day | ORAL | 0 refills | Status: AC

## 2016-09-01 NOTE — Telephone Encounter (Signed)
Rxs sent

## 2016-09-01 NOTE — Telephone Encounter (Signed)
[facility] pharmacy called in to request a medication refill for pt on 2 Rx.  SYMBICORT  topiramate- pt is requesting a 90 day supply.     Pharmacy: [facility] [phone] Address- [address] [place], [address]

## 2016-09-19 ENCOUNTER — Encounter: Payer: Self-pay | Admitting: Internal Medicine

## 2016-11-19 ENCOUNTER — Ambulatory Visit: Payer: BLUE CROSS/BLUE SHIELD | Admitting: Internal Medicine

## 2017-01-02 ENCOUNTER — Other Ambulatory Visit: Payer: Self-pay | Admitting: Internal Medicine

## 2017-06-27 IMAGING — DX DG FOOT COMPLETE 3+V*L*
3 series · 3 of 3 positions shown · non-contrast
Comparison: None.

CLINICAL DATA: Left medial foot and ankle pain for 1 week

EXAM:
LEFT FOOT - COMPLETE 3+ VIEW

[foot ap]
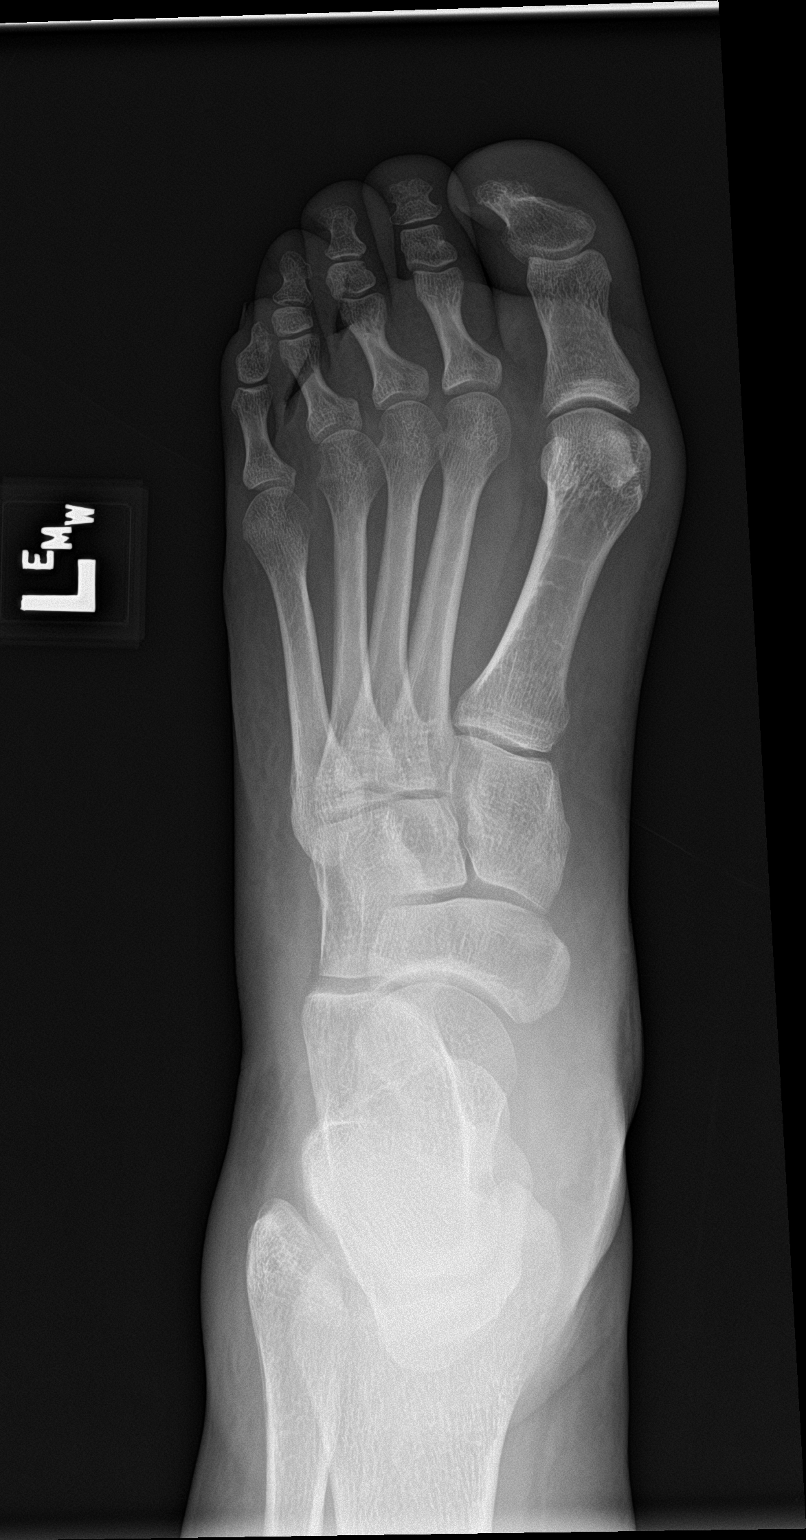

[foot obl]
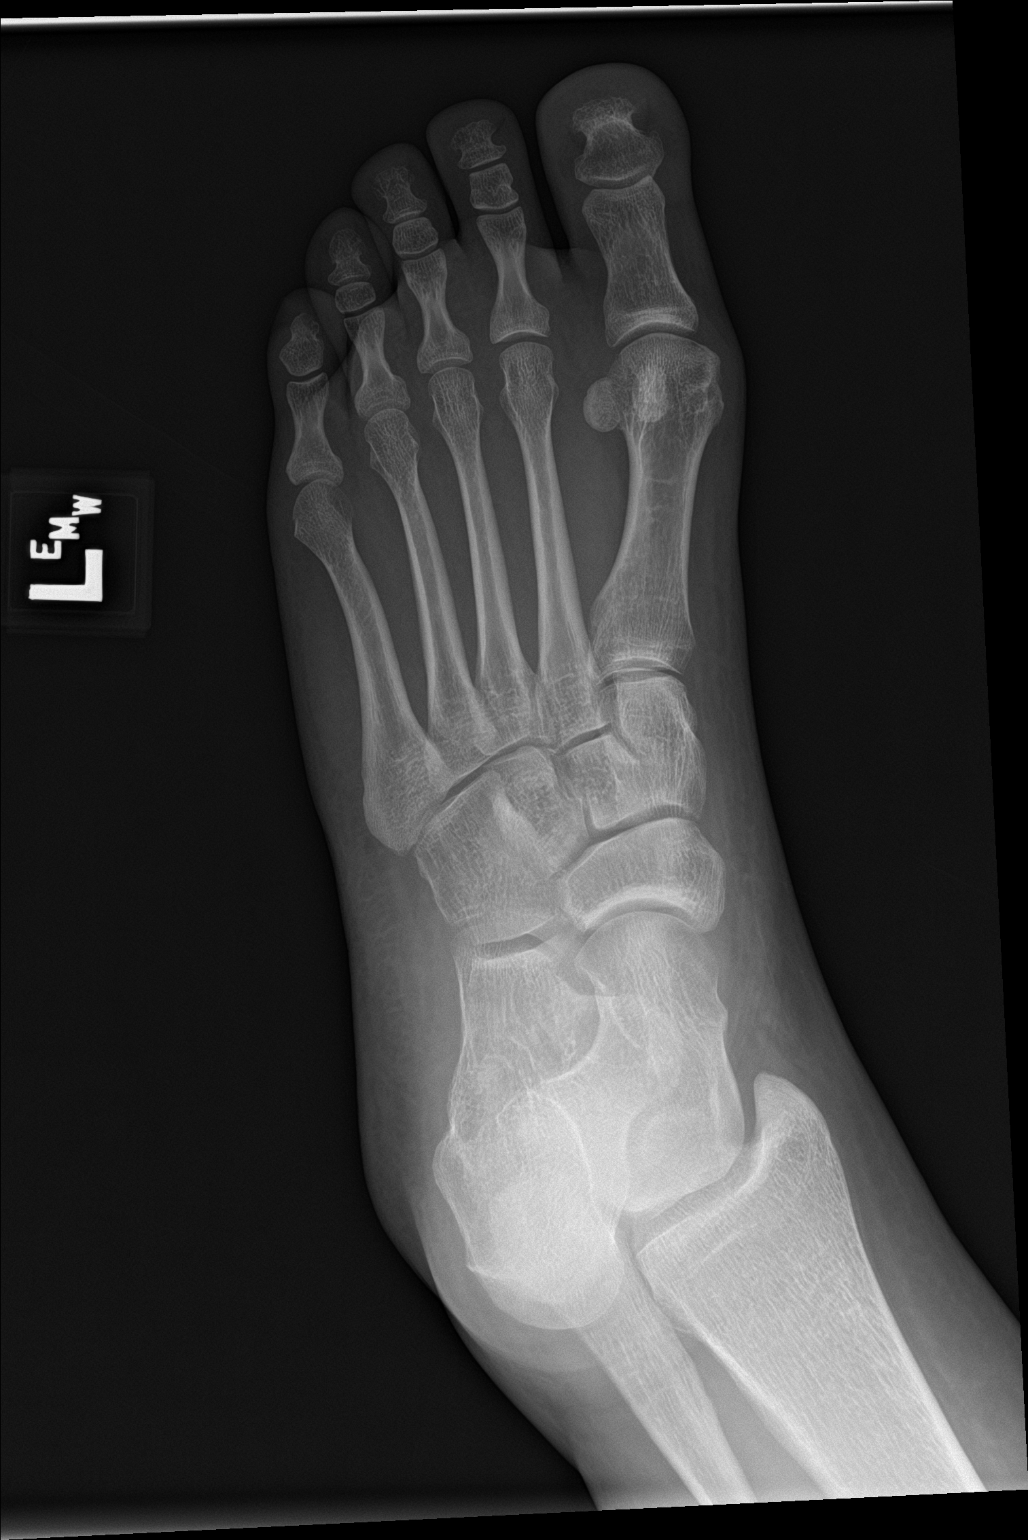

[foot lat]
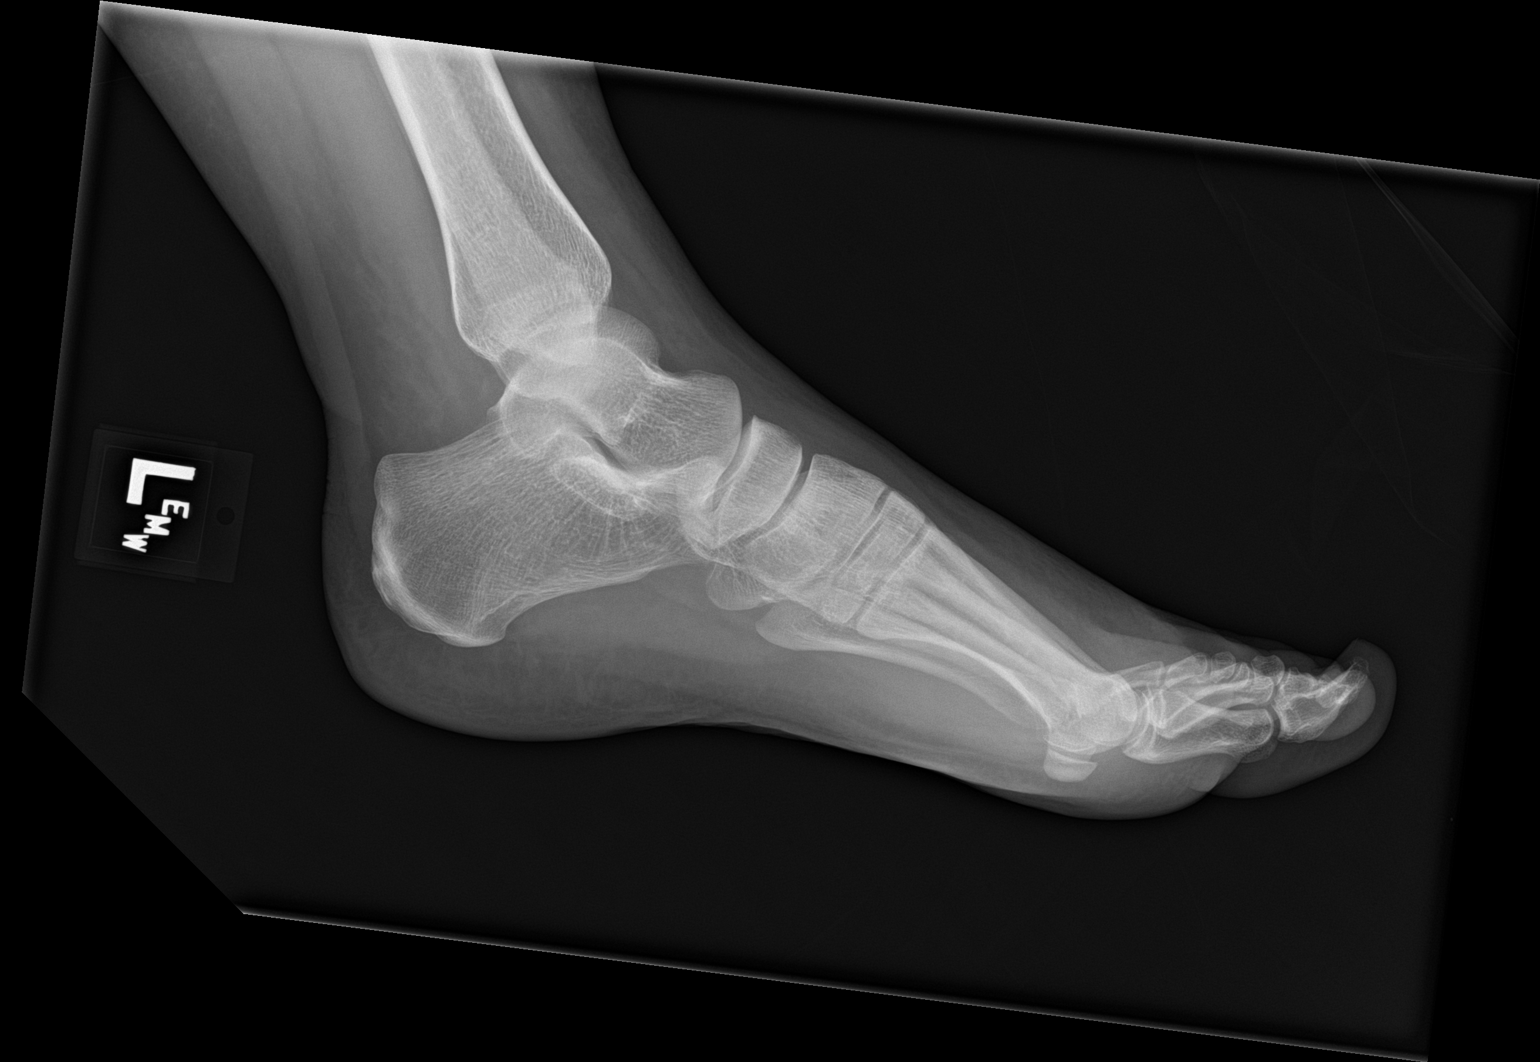

[3 of 3 positions shown; findings below may reference images not displayed]

FINDINGS: There is no evidence of fracture or dislocation. There is no
evidence of arthropathy or other focal bone abnormality. Soft
tissues are unremarkable.
IMPRESSION: Negative.
